# Patient Record
Sex: Male | Born: 1955 | Race: White | Hispanic: No | Marital: Married | Smoking: Current every day smoker
Health system: Southern US, Community
[De-identification: ages and names within clinical notes are randomized; demographics above are authoritative.]

## PROBLEM LIST (undated history)

## (undated) DIAGNOSIS — Z8719 Personal history of other diseases of the digestive system: Secondary | ICD-10-CM

## (undated) DIAGNOSIS — Z87442 Personal history of urinary calculi: Secondary | ICD-10-CM

## (undated) DIAGNOSIS — I10 Essential (primary) hypertension: Secondary | ICD-10-CM

## (undated) DIAGNOSIS — C801 Malignant (primary) neoplasm, unspecified: Secondary | ICD-10-CM

## (undated) DIAGNOSIS — J189 Pneumonia, unspecified organism: Secondary | ICD-10-CM

## (undated) DIAGNOSIS — N289 Disorder of kidney and ureter, unspecified: Secondary | ICD-10-CM

## (undated) DIAGNOSIS — I639 Cerebral infarction, unspecified: Secondary | ICD-10-CM

## (undated) HISTORY — PX: APPENDECTOMY: SHX54

## (undated) HISTORY — PX: LITHOTRIPSY: SUR834

## (undated) HISTORY — PX: HERNIA REPAIR: SHX51

## (undated) HISTORY — PX: SHOULDER SURGERY: SHX246

## (undated) HISTORY — PX: EYE SURGERY: SHX253

## (undated) HISTORY — PX: CHOLECYSTECTOMY: SHX55

---

## 2009-03-16 ENCOUNTER — Emergency Department: Payer: Self-pay | Admitting: Emergency Medicine

## 2009-09-06 ENCOUNTER — Ambulatory Visit: Payer: Self-pay | Admitting: Emergency Medicine

## 2009-09-06 DIAGNOSIS — I10 Essential (primary) hypertension: Secondary | ICD-10-CM | POA: Insufficient documentation

## 2009-09-06 DIAGNOSIS — N200 Calculus of kidney: Secondary | ICD-10-CM

## 2009-09-06 LAB — CONVERTED CEMR LAB
Nitrite: NEGATIVE
Specific Gravity, Urine: 1.02
pH: 6

## 2010-03-02 NOTE — Assessment & Plan Note (Signed)
Summary: Frequent, painful urination x 2 dys rm 2   Vital Signs:  Patient Profile:   55 Years Old Male CC:      Painful, frequent urination x 2 dys Height:     66 inches Weight:      168 pounds O2 Sat:      98 % O2 treatment:    Room Air Temp:     99.3 degrees F oral Pulse rate:   101 / minute Pulse rhythm:   regular Resp:     15 per minute BP sitting:   113 / 76  (right arm) Cuff size:   regular  Vitals Entered By: Areta Haber CMA (September 06, 2009 1:57 PM)                  Current Allergies (reviewed today): ! CODEINE     History of Present Illness Chief Complaint: Painful, frequent urination x 2 dys History of Present Illness: Dysuria, frequency for 2 days.  +hematuria, abdominal pain.  +h/o kidney stones and has a urologist.  No F/C/N/V.  Tolerating P.O.  No back or flank pain, no testicular pain, no hernias. No other medical issues. Also c/o R ear pain which is getting better.  Current Problems: NEPHROLITHIASIS (ICD-592.0) HYPERTENSION (ICD-401.9)   Current Meds LISINOPRIL 40 MG TABS (LISINOPRIL) 1 tab by mouth once daily VICODIN 5-500 MG TABS (HYDROCODONE-ACETAMINOPHEN) 1 tab by mouth Q6 hours as needed severe pain  REVIEW OF SYSTEMS Constitutional Symptoms      Denies fever, chills, night sweats, weight loss, weight gain, and fatigue.  Eyes       Denies change in vision, eye pain, eye discharge, glasses, contact lenses, and eye surgery. Ear/Nose/Throat/Mouth       Denies hearing loss/aids, change in hearing, ear pain, ear discharge, dizziness, frequent runny nose, frequent nose bleeds, sinus problems, sore throat, hoarseness, and tooth pain or bleeding.  Respiratory       Denies dry cough, productive cough, wheezing, shortness of breath, asthma, bronchitis, and emphysema/COPD.  Cardiovascular       Denies murmurs, chest pain, and tires easily with exhertion.    Gastrointestinal       Denies stomach pain, nausea/vomiting, diarrhea, constipation,  blood in bowel movements, and indigestion. Genitourniary       Complains of painful urination and kidney stones.      Denies loss of urinary control.      Comments: Frequent x 2 dys Neurological       Denies paralysis, seizures, and fainting/blackouts. Musculoskeletal       Denies muscle pain, joint pain, joint stiffness, decreased range of motion, redness, swelling, muscle weakness, and gout.  Skin       Denies bruising, unusual mles/lumps or sores, and hair/skin or nail changes.  Psych       Denies mood changes, temper/anger issues, anxiety/stress, speech problems, depression, and sleep problems.  Past History:  Past Medical History: Hypertension  Past Surgical History: Hernia Melonoma  Family History: Family History Hypertension Family History of Prostate CA 1st degree relative <50  Social History: Married Current Smoker - 1-1 1/2 packs daily Alcohol use-no Drug use-no Regular exercise-no Smoking Status:  current Drug Use:  no Does Patient Exercise:  no Physical Exam General appearance: well developed, well nourished, mild distress Head: normocephalic, atraumatic Ears: normal, no lesions or deformities Nasal: mucosa pink, nonedematous, no septal deviation, turbinates normal Oral/Pharynx: tongue normal, posterior pharynx without erythema or exudate Abdomen: + Suprapubic tenderness.  Murphy's and  Mcburney's neg.  ND.  +BS4Q.  R renal bruit heard. Soft. GU: normal Back: no CVAT MSE: oriented to time, place, and person Assessment New Problems: NEPHROLITHIASIS (ICD-592.0) HYPERTENSION (ICD-401.9)  LIkely kidney stone from UA results and physical exam.  However, I also discussed DDx with pt of appendicitis, AAA  Patient Education: Patient and/or caregiver instructed in the following: fluids.  Plan New Medications/Changes: VICODIN 5-500 MG TABS (HYDROCODONE-ACETAMINOPHEN) 1 tab by mouth Q6 hours as needed severe pain  #20 x 0, 09/06/2009, Hoyt Koch  MD  New Orders: New Patient Level III [phone] UA Dipstick w/o Micro (automated)  [81003] Planning Comments:   If pain gets any worse, moves to RLQ, N/V, Po intolerance, CP or any new symptoms, go to the ER He chooses not to have any additional imaging today b/c of no insurance (CT or KUB) and will wait to see if it gets worse. I suggest he call his urologist in the morning to make an appt Strain urine with strainer I gave to him  Follow Up: With urology this week  The patient and/or caregiver has been counseled thoroughly with regard to medications prescribed including dosage, schedule, interactions, rationale for use, and possible side effects and they verbalize understanding.  Diagnoses and expected course of recovery discussed and will return if not improved as expected or if the condition worsens. Patient and/or caregiver verbalized understanding.  Prescriptions: VICODIN 5-500 MG TABS (HYDROCODONE-ACETAMINOPHEN) 1 tab by mouth Q6 hours as needed severe pain  #20 x 0   Entered and Authorized by:   Hoyt Koch MD   Signed by:   Hoyt Koch MD on 09/06/2009   Method used:   Print then Give to Patient   RxID:   [id number]   Orders Added: 1)  New Patient Level III [38756] 2)  UA Dipstick w/o Micro (automated)  [81003]   Laboratory Results   Urine Tests  Date/Time Received: September 06, 2009 2:19 PM  Date/Time Reported: September 06, 2009 2:19 PM   Routine Urinalysis   Color: orange Appearance: Clear Glucose: negative   (Normal Range: Negative) Bilirubin: small   (Normal Range: Negative) Ketone: trace (5)   (Normal Range: Negative) Spec. Gravity: 1.020   (Normal Range: 1.003-1.035) Blood: moderate   (Normal Range: Negative) pH: 6.0   (Normal Range: 5.0-8.0) Protein: >=300   (Normal Range: Negative) Urobilinogen: 1.0   (Normal Range: 0-1) Nitrite: negative   (Normal Range: Negative) Leukocyte Esterace: negative   (Normal Range: Negative)

## 2013-01-31 DIAGNOSIS — I639 Cerebral infarction, unspecified: Secondary | ICD-10-CM

## 2013-01-31 HISTORY — DX: Cerebral infarction, unspecified: I63.9

## 2013-08-08 ENCOUNTER — Inpatient Hospital Stay (HOSPITAL_COMMUNITY)
Admission: EM | Admit: 2013-08-08 | Discharge: 2013-08-11 | DRG: 684 | Disposition: A | Discharge status: Home / Self Care | Payer: BC Managed Care – PPO | Attending: Internal Medicine | Admitting: Internal Medicine

## 2013-08-08 ENCOUNTER — Emergency Department (HOSPITAL_COMMUNITY): Payer: BC Managed Care – PPO

## 2013-08-08 ENCOUNTER — Encounter (HOSPITAL_COMMUNITY): Payer: Self-pay | Admitting: Emergency Medicine

## 2013-08-08 DIAGNOSIS — F17200 Nicotine dependence, unspecified, uncomplicated: Secondary | ICD-10-CM | POA: Diagnosis present

## 2013-08-08 DIAGNOSIS — Z7982 Long term (current) use of aspirin: Secondary | ICD-10-CM

## 2013-08-08 DIAGNOSIS — N179 Acute kidney failure, unspecified: Principal | ICD-10-CM | POA: Diagnosis present

## 2013-08-08 DIAGNOSIS — Z833 Family history of diabetes mellitus: Secondary | ICD-10-CM

## 2013-08-08 DIAGNOSIS — Z8249 Family history of ischemic heart disease and other diseases of the circulatory system: Secondary | ICD-10-CM

## 2013-08-08 DIAGNOSIS — Z85820 Personal history of malignant melanoma of skin: Secondary | ICD-10-CM

## 2013-08-08 DIAGNOSIS — Z8051 Family history of malignant neoplasm of kidney: Secondary | ICD-10-CM

## 2013-08-08 DIAGNOSIS — Q613 Polycystic kidney, unspecified: Secondary | ICD-10-CM

## 2013-08-08 DIAGNOSIS — N200 Calculus of kidney: Secondary | ICD-10-CM

## 2013-08-08 DIAGNOSIS — Z885 Allergy status to narcotic agent status: Secondary | ICD-10-CM

## 2013-08-08 DIAGNOSIS — D638 Anemia in other chronic diseases classified elsewhere: Secondary | ICD-10-CM | POA: Diagnosis present

## 2013-08-08 DIAGNOSIS — C4490 Unspecified malignant neoplasm of skin, unspecified: Secondary | ICD-10-CM | POA: Diagnosis present

## 2013-08-08 DIAGNOSIS — Z820 Family history of epilepsy and other diseases of the nervous system: Secondary | ICD-10-CM

## 2013-08-08 DIAGNOSIS — I10 Essential (primary) hypertension: Secondary | ICD-10-CM | POA: Diagnosis present

## 2013-08-08 HISTORY — DX: Essential (primary) hypertension: I10

## 2013-08-08 LAB — CBC WITH DIFFERENTIAL/PLATELET
BASOS ABS: 0 10*3/uL (ref 0.0–0.1)
Basophils Relative: 0 % (ref 0–1)
Eosinophils Absolute: 0.3 10*3/uL (ref 0.0–0.7)
Eosinophils Relative: 3 % (ref 0–5)
HCT: 37.5 % — ABNORMAL LOW (ref 39.0–52.0)
Hemoglobin: 12.9 g/dL — ABNORMAL LOW (ref 13.0–17.0)
LYMPHS PCT: 24 % (ref 12–46)
Lymphs Abs: 2.5 10*3/uL (ref 0.7–4.0)
MCH: 34.9 pg — ABNORMAL HIGH (ref 26.0–34.0)
MCHC: 34.4 g/dL (ref 30.0–36.0)
MCV: 101.4 fL — ABNORMAL HIGH (ref 78.0–100.0)
Monocytes Absolute: 0.9 10*3/uL (ref 0.1–1.0)
Monocytes Relative: 8 % (ref 3–12)
NEUTROS ABS: 6.7 10*3/uL (ref 1.7–7.7)
NEUTROS PCT: 65 % (ref 43–77)
PLATELETS: 293 10*3/uL (ref 150–400)
RBC: 3.7 MIL/uL — ABNORMAL LOW (ref 4.22–5.81)
RDW: 13.5 % (ref 11.5–15.5)
WBC: 10.5 10*3/uL (ref 4.0–10.5)

## 2013-08-08 LAB — COMPREHENSIVE METABOLIC PANEL
ALT: 14 U/L (ref 0–53)
AST: 14 U/L (ref 0–37)
Albumin: 3.9 g/dL (ref 3.5–5.2)
Alkaline Phosphatase: 88 U/L (ref 39–117)
Anion gap: 19 — ABNORMAL HIGH (ref 5–15)
BUN: 50 mg/dL — ABNORMAL HIGH (ref 6–23)
CALCIUM: 8.4 mg/dL (ref 8.4–10.5)
CHLORIDE: 105 meq/L (ref 96–112)
CO2: 17 meq/L — AB (ref 19–32)
Creatinine, Ser: 5.62 mg/dL — ABNORMAL HIGH (ref 0.50–1.35)
GFR calc Af Amer: 12 mL/min — ABNORMAL LOW (ref >90)
GFR, EST NON AFRICAN AMERICAN: 10 mL/min — AB (ref >90)
Glucose, Bld: 100 mg/dL — ABNORMAL HIGH (ref 70–99)
POTASSIUM: 4.8 meq/L (ref 3.7–5.3)
SODIUM: 141 meq/L (ref 137–147)
Total Protein: 7.1 g/dL (ref 6.0–8.3)

## 2013-08-08 LAB — RENAL FUNCTION PANEL
ANION GAP: 15 (ref 5–15)
Albumin: 3.2 g/dL — ABNORMAL LOW (ref 3.5–5.2)
BUN: 48 mg/dL — ABNORMAL HIGH (ref 6–23)
CALCIUM: 7.8 mg/dL — AB (ref 8.4–10.5)
CO2: 21 mEq/L (ref 19–32)
Chloride: 104 mEq/L (ref 96–112)
Creatinine, Ser: 4.97 mg/dL — ABNORMAL HIGH (ref 0.50–1.35)
GFR, EST AFRICAN AMERICAN: 14 mL/min — AB (ref >90)
GFR, EST NON AFRICAN AMERICAN: 12 mL/min — AB (ref >90)
GLUCOSE: 93 mg/dL (ref 70–99)
POTASSIUM: 4.2 meq/L (ref 3.7–5.3)
Phosphorus: 4.8 mg/dL — ABNORMAL HIGH (ref 2.3–4.6)
SODIUM: 140 meq/L (ref 137–147)

## 2013-08-08 LAB — CREATININE, URINE, RANDOM: Creatinine, Urine: 177.63 mg/dL

## 2013-08-08 LAB — URINALYSIS, ROUTINE W REFLEX MICROSCOPIC
Bilirubin Urine: NEGATIVE
GLUCOSE, UA: NEGATIVE mg/dL
HGB URINE DIPSTICK: NEGATIVE
Ketones, ur: NEGATIVE mg/dL
Leukocytes, UA: NEGATIVE
Nitrite: NEGATIVE
PROTEIN: 100 mg/dL — AB
Specific Gravity, Urine: 1.02 (ref 1.005–1.030)
Urobilinogen, UA: 0.2 mg/dL (ref 0.0–1.0)
pH: 5 (ref 5.0–8.0)

## 2013-08-08 LAB — PROTEIN / CREATININE RATIO, URINE
CREATININE, URINE: 180.14 mg/dL
Protein Creatinine Ratio: 0.28 — ABNORMAL HIGH (ref 0.00–0.15)
Total Protein, Urine: 51 mg/dL

## 2013-08-08 LAB — PROTEIN, URINE, RANDOM: Total Protein, Urine: 51 mg/dL

## 2013-08-08 LAB — URINE MICROSCOPIC-ADD ON

## 2013-08-08 MED ORDER — STERILE WATER FOR INJECTION IV SOLN
INTRAVENOUS | Status: DC
  Administered 2013-08-09 – 2013-08-10 (×4): via INTRAVENOUS
  Filled 2013-08-08 (×12): qty 850

## 2013-08-08 MED ORDER — HEPARIN SODIUM (PORCINE) 5000 UNIT/ML IJ SOLN
5000.0000 [IU] | Freq: Three times a day (TID) | INTRAMUSCULAR | Status: DC
  Administered 2013-08-08 – 2013-08-11 (×8): 5000 [IU] via SUBCUTANEOUS
  Filled 2013-08-08 (×11): qty 1

## 2013-08-08 MED ORDER — ACETAMINOPHEN 325 MG PO TABS: 650.0000 mg | ORAL_TABLET | Freq: Four times a day (QID) | ORAL | Status: DC | PRN

## 2013-08-08 MED ORDER — ASPIRIN EC 81 MG PO TBEC
81.0000 mg | DELAYED_RELEASE_TABLET | Freq: Every day | ORAL | Status: DC
  Administered 2013-08-09 – 2013-08-11 (×3): 81 mg via ORAL
  Filled 2013-08-08 (×3): qty 1

## 2013-08-08 MED ORDER — SODIUM CHLORIDE 0.9 % IV BOLUS (SEPSIS)
250.0000 mL | Freq: Once | INTRAVENOUS | Status: AC
  Administered 2013-08-08: 250 mL via INTRAVENOUS

## 2013-08-08 MED ORDER — NICOTINE 21 MG/24HR TD PT24
21.0000 mg | MEDICATED_PATCH | Freq: Every day | TRANSDERMAL | Status: DC
  Administered 2013-08-09 – 2013-08-11 (×4): 21 mg via TRANSDERMAL
  Filled 2013-08-08 (×4): qty 1

## 2013-08-08 MED ORDER — ACETAMINOPHEN 650 MG RE SUPP: 650.0000 mg | Freq: Four times a day (QID) | RECTAL | Status: DC | PRN

## 2013-08-08 MED ORDER — SODIUM CHLORIDE 0.9 % IV BOLUS (SEPSIS)
1000.0000 mL | Freq: Once | INTRAVENOUS | Status: AC
  Administered 2013-08-08: 1000 mL via INTRAVENOUS

## 2013-08-08 NOTE — Progress Notes (Signed)
Unit CM UR Completed by [facility] ED CM  W. Felipa Laroche RN  

## 2013-08-08 NOTE — H&P (Signed)
[facility] History and Physical  Martin Schneider KXF:[id number] DOB: Jan 04, 1956 DOA: 08/08/2013  Referring physician: EDP PCP: Dwan Bolt, MD   Chief Complaint: abnormal labs  HPI: Martin Schneider is a 58 y.o. male  With h/o HTN sent to ED with abnormal creatinine.  Patient feels well and has no complaints.  In ED, creatinine is 5.62. BUN 50, bicarbonate 17. Normal potassium.  Recently started on Azor. Previously on lisinopril.  Per EDP who called PCP, last creatinine was. 2.9. Renal US shows bilateral renal cysts without obstruction.  Patient denies change in amount or color of urine, denies edema. Denies heavy NSAID use.  Nephrology has already seen the patient, ordered bicarbonate drip and renal artery dopplers.   Review of Systems:  Systems reviewed. As above, otherwise negative.  Past Medical History  Diagnosis Date  . Hypertension   melanoma   Past Surgical History  Procedure Laterality Date  . Cholecystectomy    . Hernia repair    . Appendectomy    melanoma resection right arm  Social History:  reports that he has been smoking Cigarettes.  He has been smoking about 1.00 pack per day. He does not have any smokeless tobacco history on file. He reports that he does not drink alcohol. His drug history is not on file.  Allergies  Allergen Reactions  . Codeine     REACTION: HA    Family History  Problem Relation Age of Onset  . Renal cancer Father   . Hypertension Father   . Alzheimer's disease Father   . Kidney failure Mother   . Diabetes Mother      Prior to Admission medications   Medication Sig Start Date End Date Taking? Authorizing Provider  amLODipine-olmesartan (AZOR) 5-40 MG per tablet Take 1 tablet by mouth daily.   Yes Historical Provider, MD  aspirin EC 81 MG tablet Take 81 mg by mouth daily.   Yes Historical Provider, MD  MAGNESIUM PO Take 1 tablet by mouth daily.   Yes Historical Provider, MD  Multiple Vitamins-Minerals (MULTIVITAL PO) Take 1  tablet by mouth daily.   Yes Historical Provider, MD   Physical Exam: Filed Vitals:   08/08/13 1708  BP: 124/76  Pulse:   Temp: 97.9 F (36.6 C)  Resp: 22    BP 124/76  Pulse 62  Temp(Src) 97.9 F (36.6 C) (Oral)  Resp 22  Wt 78.019 kg (172 lb)  SpO2 100%  BP 104/64  Pulse 66  Temp(Src) 98.9 F (37.2 C) (Oral)  Resp 18  Wt 77.973 kg (171 lb 14.4 oz)  SpO2 97%  General Appearance:    Alert, cooperative, no distress, appears stated age  Head:    Normocephalic, without obvious abnormality, atraumatic  Eyes:    PERRL, conjunctiva/corneas clear, EOM's intact, fundi    benign, both eyes          Nose:   Nares normal, septum midline, mucosa normal, no drainage   or sinus tenderness  Throat:   Lips, mucosa, and tongue normal; teeth and gums normal  Neck:   Supple, symmetrical, trachea midline, no adenopathy;       thyroid:  No enlargement/tenderness/nodules; no carotid   bruit or JVD  Back:     Symmetric, no curvature, ROM normal, no CVA tenderness  Lungs:     Clear to auscultation bilaterally, respirations unlabored  Chest wall:    No tenderness or deformity  Heart:    Regular rate and rhythm, S1 and S2 normal,  no murmur, rub   or gallop  Abdomen:     Soft, non-tender, bowel sounds active all four quadrants,    no masses, no organomegaly  Genitalia:    deferred  Rectal:    deferred  Extremities:   Extremities normal, atraumatic, no cyanosis or edema  Pulses:   2+ and symmetric all extremities  Skin:   Ulceration bridge of nose, nodular areas behind right ear and right cheek  Lymph nodes:   Cervical, supraclavicular, and axillary nodes normal  Neurologic:   CNII-XII intact. Normal strength, sensation and reflexes      throughout            Labs on Admission:  Basic Metabolic Panel:  Recent Labs Lab 08/08/13 1222  NA 141  K 4.8  CL 105  CO2 17*  GLUCOSE 100*  BUN 50*  CREATININE 5.62*  CALCIUM 8.4   Liver Function Tests:  Recent Labs Lab  08/08/13 1222  AST 14  ALT 14  ALKPHOS 88  BILITOT <0.2*  PROT 7.1  ALBUMIN 3.9   No results found for this basename: LIPASE, AMYLASE,  in the last 168 hours No results found for this basename: AMMONIA,  in the last 168 hours CBC:  Recent Labs Lab 08/08/13 1222  WBC 10.5  NEUTROABS 6.7  HGB 12.9*  HCT 37.5*  MCV 101.4*  PLT 293   Cardiac Enzymes: No results found for this basename: CKTOTAL, CKMB, CKMBINDEX, TROPONINI,  in the last 168 hours  BNP (last 3 results) No results found for this basename: PROBNP,  in the last 8760 hours CBG: No results found for this basename: GLUCAP,  in the last 168 hours  Radiological Exams on Admission: Ct Head Wo Contrast  08/08/2013   CLINICAL DATA:  Blurred vision  EXAM: CT HEAD WITHOUT CONTRAST  TECHNIQUE: Contiguous axial images were obtained from the base of the skull through the vertex without intravenous contrast.  COMPARISON:  None.  FINDINGS: Mild atrophy. Chronic microvascular ischemic change in the white matter. Chronic ischemia head of caudate and left basal ganglia.  Negative for acute infarct. Negative for hemorrhage or mass. Calvarium intact.  Mild mucosal edema in the paranasal sinuses.  IMPRESSION: Chronic ischemic change.  No acute abnormality.   Electronically Signed   By: Franchot Gallo M.D.   On: 08/08/2013 17:08   US Renal  08/08/2013   CLINICAL DATA:  Left-sided flank pain with elevated renal functions  EXAM: RENAL/URINARY TRACT ULTRASOUND COMPLETE  COMPARISON:  None.  FINDINGS: Right Kidney:  Length: 9.2 cm. Multiple cystic lesions are identified throughout the right kidney. The largest of these lies in the upper pole measuring 2.5 cm.  Left Kidney:  Length: 8.9 cm. Multiple cysts are noted throughout the left kidney. The largest of these measures 4.8 cm within the lower pole of the left kidney. The bladder is partially distended.  Bladder:  Appears normal for degree of bladder distention.  IMPRESSION: Bilateral renal cystic  change.  No obstructive changes are noted.   Electronically Signed   By: Inez Catalina M.D.   On: 08/08/2013 16:17    Assessment/Plan   Acute renal failure: stop azor. Bicarb gtt. Other workup per nephrology.   Active Problems:   HYPERTENSION: hold meds for now.   Skin cancer: h/o melanoma resection. 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 Missed appointment with dermatologist yesterday. Reschedule and encouraged sunscreen use. Tobacco abuse: patch, counseled against   Code Status: full Family  Communication: wife Disposition Plan: home  Time spent: 60 min  [facility] Pager [phone]

## 2013-08-08 NOTE — ED Notes (Signed)
Per pt sts he went to the doctor for routine check up and was told to come here for renal failure due to abnormal blood work. Denies any pain besides his left leg.

## 2013-08-08 NOTE — ED Notes (Signed)
Nephrologist at bedside

## 2013-08-08 NOTE — H&P (Signed)
Patient ID: Martin Schneider male   DOB: 01/11/1956 58 y.o.   MRN: [id number]  Reason for Consult:  AKI Referring Physician:  ED  HPI: Martin Schneider is a 58 y.o. male with a PMH significant for HTN and nephrolithiasis (2011).  He presents to the ED upon advise of his PCP, Dr. Wilson Singer, that he come to the ED due to renal failure (Cr 5.62, BUN 50).  He reports previously being told 1 month ago that his kidneys were not as good as they could be.  Used to be on lisinopril but was put on azor 1 month ago.  Denies any CP, SOB, V/D/C, abdominal pain, hematochezia, melena, dysuria, hematuria, difficulty with urination.  Endorses mild nausea several days ago but no vomiting.  No rash.  No recent NSAID use, no procedures, or exposure to IV contrast.  Had a recent exercise stress test 2 weeks ago.  Smokes 1.5-2 ppd since the age of 50. Denies current alcohol (previously drank moonshine) or recreational drug use.  No FH of dialysis but father had a "growth" on his kidney and had a subsequent nephrectomy in his 68s.     PMH:   Past Medical History  Diagnosis Date  . Hypertension     PSH:   Past Surgical History  Procedure Laterality Date  . Cholecystectomy    . Hernia repair    . Appendectomy      Allergies:  Allergies  Allergen Reactions  . Codeine     REACTION: HA    Medications:   Prior to Admission medications   Medication Sig Start Date End Date Taking? Authorizing Provider  amLODipine-olmesartan (AZOR) 5-40 MG per tablet Take 1 tablet by mouth daily.   Yes Historical Provider, MD  aspirin EC 81 MG tablet Take 81 mg by mouth daily.   Yes Historical Provider, MD  MAGNESIUM PO Take 1 tablet by mouth daily.   Yes Historical Provider, MD  Multiple Vitamins-Minerals (MULTIVITAL PO) Take 1 tablet by mouth daily.   Yes Historical Provider, MD    Discontinued Meds:  There are no discontinued medications.  Social History:  reports that he has been smoking.  He does not have any smokeless tobacco  history on file. He reports that he does not drink alcohol. His drug history is not on file.  Family History:  History reviewed. No pertinent family history.  Review of Systems: Noted in HPI.   Creatinine, Ser  Date/Time Value Ref Range Status  08/08/2013 12:22 PM 5.62* 0.50 - 1.35 mg/dL Final    Recent Labs Lab 08/08/13 1222  NA 141  K 4.8  CL 105  CO2 17*  GLUCOSE 100*  BUN 50*  CREATININE 5.62*  CALCIUM 8.4    Recent Labs Lab 08/08/13 1222  WBC 10.5  NEUTROABS 6.7  HGB 12.9*  HCT 37.5*  MCV 101.4*  PLT 293   Liver Function Tests:  Recent Labs Lab 08/08/13 1222  AST 14  ALT 14  ALKPHOS 88  BILITOT <0.2*  PROT 7.1  ALBUMIN 3.9   No results found for this basename: LIPASE, AMYLASE,  in the last 168 hours No results found for this basename: AMMONIA,  in the last 168 hours Cardiac Enzymes: No results found for this basename: CKTOTAL, CKMB, CKMBINDEX, TROPONINI,  in the last 168 hours Iron Studies: No results found for this basename: IRON, TIBC, TRANSFERRIN, FERRITIN,  in the last 72 hours  Results for orders placed during the hospital encounter of 08/08/13 (from the  past 48 hour(s))  URINALYSIS, ROUTINE W REFLEX MICROSCOPIC     Status: Abnormal   Collection Time    08/08/13 12:10 PM      Result Value Ref Range   Color, Urine YELLOW  YELLOW   APPearance CLOUDY (*) CLEAR   Specific Gravity, Urine 1.020  1.005 - 1.030   pH 5.0  5.0 - 8.0   Glucose, UA NEGATIVE  NEGATIVE mg/dL   Hgb urine dipstick NEGATIVE  NEGATIVE   Bilirubin Urine NEGATIVE  NEGATIVE   Ketones, ur NEGATIVE  NEGATIVE mg/dL   Protein, ur 100 (*) NEGATIVE mg/dL   Urobilinogen, UA 0.2  0.0 - 1.0 mg/dL   Nitrite NEGATIVE  NEGATIVE   Leukocytes, UA NEGATIVE  NEGATIVE  URINE MICROSCOPIC-ADD ON     Status: Abnormal   Collection Time    08/08/13 12:10 PM      Result Value Ref Range   Squamous Epithelial / LPF FEW (*) RARE   WBC, UA 3-6  <3 WBC/hpf   Bacteria, UA RARE  RARE  CBC WITH  DIFFERENTIAL     Status: Abnormal   Collection Time    08/08/13 12:22 PM      Result Value Ref Range   WBC 10.5  4.0 - 10.5 K/uL   RBC 3.70 (*) 4.22 - 5.81 MIL/uL   Hemoglobin 12.9 (*) 13.0 - 17.0 g/dL   HCT 37.5 (*) 39.0 - 52.0 %   MCV 101.4 (*) 78.0 - 100.0 fL   MCH 34.9 (*) 26.0 - 34.0 pg   MCHC 34.4  30.0 - 36.0 g/dL   RDW 13.5  11.5 - 15.5 %   Platelets 293  150 - 400 K/uL   Neutrophils Relative % 65  43 - 77 %   Neutro Abs 6.7  1.7 - 7.7 K/uL   Lymphocytes Relative 24  12 - 46 %   Lymphs Abs 2.5  0.7 - 4.0 K/uL   Monocytes Relative 8  3 - 12 %   Monocytes Absolute 0.9  0.1 - 1.0 K/uL   Eosinophils Relative 3  0 - 5 %   Eosinophils Absolute 0.3  0.0 - 0.7 K/uL   Basophils Relative 0  0 - 1 %   Basophils Absolute 0.0  0.0 - 0.1 K/uL  COMPREHENSIVE METABOLIC PANEL     Status: Abnormal   Collection Time    08/08/13 12:22 PM      Result Value Ref Range   Sodium 141  137 - 147 mEq/L   Potassium 4.8  3.7 - 5.3 mEq/L   Chloride 105  96 - 112 mEq/L   CO2 17 (*) 19 - 32 mEq/L   Glucose, Bld 100 (*) 70 - 99 mg/dL   BUN 50 (*) 6 - 23 mg/dL   Creatinine, Ser 5.62 (*) 0.50 - 1.35 mg/dL   Calcium 8.4  8.4 - 10.5 mg/dL   Total Protein 7.1  6.0 - 8.3 g/dL   Albumin 3.9  3.5 - 5.2 g/dL   AST 14  0 - 37 U/L   ALT 14  0 - 53 U/L   Alkaline Phosphatase 88  39 - 117 U/L   Total Bilirubin <0.2 (*) 0.3 - 1.2 mg/dL   GFR calc non Af Amer 10 (*) >90 mL/min   GFR calc Af Amer 12 (*) >90 mL/min   Comment: (NOTE)     The eGFR has been calculated using the CKD EPI equation.     This calculation has not been  validated in all clinical situations.     eGFR's persistently <90 mL/min signify possible Chronic Kidney     Disease.   Anion gap 19 (*) 5 - 15    Ct Head Wo Contrast  08/08/2013   CLINICAL DATA:  Blurred vision  EXAM: CT HEAD WITHOUT CONTRAST  TECHNIQUE: Contiguous axial images were obtained from the base of the skull through the vertex without intravenous contrast.  COMPARISON:  None.   FINDINGS: Mild atrophy. Chronic microvascular ischemic change in the white matter. Chronic ischemia head of caudate and left basal ganglia.  Negative for acute infarct. Negative for hemorrhage or mass. Calvarium intact.  Mild mucosal edema in the paranasal sinuses.  IMPRESSION: Chronic ischemic change.  No acute abnormality.   Electronically Signed   By: Franchot Gallo M.D.   On: 08/08/2013 17:08   US Renal  08/08/2013   CLINICAL DATA:  Left-sided flank pain with elevated renal functions  EXAM: RENAL/URINARY TRACT ULTRASOUND COMPLETE  COMPARISON:  None.  FINDINGS: Right Kidney:  Length: 9.2 cm. Multiple cystic lesions are identified throughout the right kidney. The largest of these lies in the upper pole measuring 2.5 cm.  Left Kidney:  Length: 8.9 cm. Multiple cysts are noted throughout the left kidney. The largest of these measures 4.8 cm within the lower pole of the left kidney. The bladder is partially distended.  Bladder:  Appears normal for degree of bladder distention.  IMPRESSION: Bilateral renal cystic change.  No obstructive changes are noted.   Electronically Signed   By: Inez Catalina M.D.   On: 08/08/2013 16:17    Physical Exam: Blood pressure 124/76, pulse 66, temperature 97.9 F (36.6 C), temperature source Oral, resp. rate 22, weight 172 lb (78.019 kg), SpO2 100.00%. Constitutional: Vital signs reviewed.  Patient is well-developed and well-nourished in NAD.  HEENT: [place]/AT, PERRL, EOMI, conjunctivae normal, no scleral icterus, moist mucous membranes Neck: Supple, trachea midline normal ROM, no JVD Cardiovascular: RRR, no MRG Pulmonary/Chest: normal effort, CTAB, no wheezes, rales, or rhonchi Abdominal: Soft. Non-tender, non-distended, bowel sounds are normal, large midline scar.  Extremities: no C/C/E Neurological: A&O x3, cranial nerve II-XII are grossly intact, moving all extremities, no asterixis  Skin: Warm, dry and intact. No rash.    Assessment/Plan:  AKI vs. CKD-- Baseline  creatinine unknown.  Pt is followed by Dr. Wilson Singer of [facility].  Unclear etiology, pt recently started azor 1 month ago.  BUN/Cr ratio<20, urinary sediment unrevealing. Likely multifactorial given relative hypotension in the setting of recently starting azor.  However, also has significant scarring and multiple bilateral cysts on renal US.  Also with significant bladder distention, perhaps postrenal also contributing.  Basically, unclear if this is AKI or a progression of CKD.   -insert foley catheter -renal artery duplex US -d/c azor -strict I/Os, daily weights -renal function panel to monitor electolytes -iPTH, spot urine Cr/alb, 24 hr urine protein, Cr  -obtain records from Dr. Wilson Singer   Metabolic abnormalities-  Bicarb 17. -start sodium bicarb IV 122m/h   HTN-  Stable. -would monitor for now -d/c home meds   Anemia- Stable, hgb 12.9, denies melena, hematochezia, no active bleeding.  -monitor   JJones Bales MD Internal Medicine Teaching Service, PGY-2 08/08/2013, 5:25 PM I have seen and examined this patient and agree with the plan of care seen, examined, eval, discussed, family counseled.        Etiology not clear. Had exposure to ARB but not clear cut risk factors for toxicity.  Small, echodense kidneys, worrisome. R/O RAS, ? CIN .  Need old records.   .  Jonasia Coiner L 08/08/2013, 6:08 PM

## 2013-08-08 NOTE — ED Notes (Signed)
Attempted report x1. 

## 2013-08-08 NOTE — ED Provider Notes (Signed)
CSN: [id number]     Arrival date & time 08/08/13  1151 History   First MD Initiated Contact with Patient 08/08/13 1344     Chief Complaint  Patient presents with  . kidney issues      (Consider location/radiation/quality/duration/timing/severity/associated sxs/prior Treatment) The history is provided by the patient. No language interpreter was used.  Martin Schneider is a 58 y/o M with PMHx of HTN presenting to the ED with lab results that have resulted from this morning in his 26 office reporting that he has acute renal failure. Patient reported that he was notified by the office of his physician's and recommended to come into the ED to be assessed. Patient reported that he has noticed mildly more blurred vision than normal. Reported that on Tuesday he felts nauseous and had one episode of emesis. Reported that these symptoms have now resolved. Patient reported that he recently started Azor approximately 4 weeks ago for blood pressure control - patient curious that this could be the issue. Denied headache, dizziness, hematuria, abdominal pain, nausea, vomiting, chest pain, shortness of breath, difficulty breathing, fever, chills, changes to medication, changes to eating habits, sudden loss of vision, weakness, numbness, tingling. PCP Dr. Wilson Singer  Past Medical History  Diagnosis Date  . Hypertension    Past Surgical History  Procedure Laterality Date  . Cholecystectomy    . Hernia repair    . Appendectomy     History reviewed. No pertinent family history. History  Substance Use Topics  . Smoking status: Current Every Day Smoker  . Smokeless tobacco: Not on file  . Alcohol Use: No    Review of Systems  Constitutional: Negative for fever and chills.  Eyes: Positive for visual disturbance.  Respiratory: Negative for chest tightness and shortness of breath.   Cardiovascular: Negative for chest pain.  Gastrointestinal: Negative for nausea, vomiting, abdominal pain, diarrhea,  constipation, blood in stool and anal bleeding.  Genitourinary: Positive for flank pain (left sided flank pain ). Negative for hematuria and decreased urine volume.  Musculoskeletal: Negative for back pain and neck pain.  Neurological: Negative for dizziness, weakness and headaches.      Allergies  Codeine  Home Medications   Prior to Admission medications   Medication Sig Start Date End Date Taking? Authorizing Provider  amLODipine-olmesartan (AZOR) 5-40 MG per tablet Take 1 tablet by mouth daily.   Yes Historical Provider, MD  aspirin EC 81 MG tablet Take 81 mg by mouth daily.   Yes Historical Provider, MD  MAGNESIUM PO Take 1 tablet by mouth daily.   Yes Historical Provider, MD  Multiple Vitamins-Minerals (MULTIVITAL PO) Take 1 tablet by mouth daily.   Yes Historical Provider, MD   BP 124/76  Pulse 62  Temp(Src) 97.9 F (36.6 C) (Oral)  Resp 22  Wt 172 lb (78.019 kg)  SpO2 100% Physical Exam  Nursing note and vitals reviewed. Constitutional: He is oriented to person, place, and time. He appears well-developed and well-nourished.  HENT:  Head: Normocephalic and atraumatic.  Mouth/Throat: Oropharynx is clear and moist. No oropharyngeal exudate.  Eyes: Conjunctivae and EOM are normal. Pupils are equal, round, and reactive to light. Right eye exhibits no discharge. Left eye exhibits no discharge.  Negative nystagmus Visual fields grossly intact   Neck: Normal range of motion. Neck supple. No tracheal deviation present.  Cardiovascular: Normal rate, regular rhythm and normal heart sounds.  Exam reveals no friction rub.   No murmur heard. Pulses:      Radial  pulses are 2+ on the right side, and 2+ on the left side.       Dorsalis pedis pulses are 2+ on the right side, and 2+ on the left side.  Cap refill less than 3 seconds Negative swelling or pitting edema identified to lower extremities bilaterally  Pulmonary/Chest: Effort normal and breath sounds normal. No respiratory  distress. He has no wheezes. He has no rales.  Abdominal: Soft. Bowel sounds are normal. He exhibits no distension. There is no tenderness. There is no rebound and no guarding.  Negative abdominal distension  BS normoactive in all 4 quadrants Abdomen soft upon palpation  Negative peritoneal signs Negative rigidity and guarding   Mild left-sided CVA tenderness  Musculoskeletal: Normal range of motion. He exhibits no edema and no tenderness.  Full ROM to upper and lower extremities without difficulty noted, negative ataxia noted.  Lymphadenopathy:    He has no cervical adenopathy.  Neurological: He is alert and oriented to person, place, and time. No cranial nerve deficit. He exhibits normal muscle tone. Coordination normal.  Cranial nerves III-XII grossly intact Strength 5+/5+ to upper and lower extremities bilaterally with resistance applied, equal distribution noted Equal grip strength bilaterally Negative facial droop Negative slurred speech Negative aphasia GCS 15 Patient able to follow commands well   Skin: Skin is warm and dry. No rash noted. No erythema.  Psychiatric: He has a normal mood and affect. His behavior is normal. Thought content normal.    ED Course  Procedures (including critical care time)  This provider spoke with Nurse at patient's PCP's office - Hassan Rowan, as per Hassan Rowan reported that the most recent Creatinine was 2.9 in June 2015.  3:39 PM This provider spoke with Dr. Jimmy Footman, Nephrology - discussed case, history, labs, imaging in great detail. Nephrology to consult.   5:24 PM This provider spoke with Dr. Remus Blake - [facility] - discussed case, labs, imaging, and vitals in great detail. Patient to be admitted to the hospital regarding acute renal failure.   Results for orders placed during the hospital encounter of 08/08/13  CBC WITH DIFFERENTIAL      Result Value Ref Range   WBC 10.5  4.0 - 10.5 K/uL   RBC 3.70 (*) 4.22 - 5.81 MIL/uL    Hemoglobin 12.9 (*) 13.0 - 17.0 g/dL   HCT 37.5 (*) 39.0 - 52.0 %   MCV 101.4 (*) 78.0 - 100.0 fL   MCH 34.9 (*) 26.0 - 34.0 pg   MCHC 34.4  30.0 - 36.0 g/dL   RDW 13.5  11.5 - 15.5 %   Platelets 293  150 - 400 K/uL   Neutrophils Relative % 65  43 - 77 %   Neutro Abs 6.7  1.7 - 7.7 K/uL   Lymphocytes Relative 24  12 - 46 %   Lymphs Abs 2.5  0.7 - 4.0 K/uL   Monocytes Relative 8  3 - 12 %   Monocytes Absolute 0.9  0.1 - 1.0 K/uL   Eosinophils Relative 3  0 - 5 %   Eosinophils Absolute 0.3  0.0 - 0.7 K/uL   Basophils Relative 0  0 - 1 %   Basophils Absolute 0.0  0.0 - 0.1 K/uL  COMPREHENSIVE METABOLIC PANEL      Result Value Ref Range   Sodium 141  137 - 147 mEq/L   Potassium 4.8  3.7 - 5.3 mEq/L   Chloride 105  96 - 112 mEq/L   CO2 17 (*) 19 -  32 mEq/L   Glucose, Bld 100 (*) 70 - 99 mg/dL   BUN 50 (*) 6 - 23 mg/dL   Creatinine, Ser 5.62 (*) 0.50 - 1.35 mg/dL   Calcium 8.4  8.4 - 10.5 mg/dL   Total Protein 7.1  6.0 - 8.3 g/dL   Albumin 3.9  3.5 - 5.2 g/dL   AST 14  0 - 37 U/L   ALT 14  0 - 53 U/L   Alkaline Phosphatase 88  39 - 117 U/L   Total Bilirubin <0.2 (*) 0.3 - 1.2 mg/dL   GFR calc non Af Amer 10 (*) >90 mL/min   GFR calc Af Amer 12 (*) >90 mL/min   Anion gap 19 (*) 5 - 15  URINALYSIS, ROUTINE W REFLEX MICROSCOPIC      Result Value Ref Range   Color, Urine YELLOW  YELLOW   APPearance CLOUDY (*) CLEAR   Specific Gravity, Urine 1.020  1.005 - 1.030   pH 5.0  5.0 - 8.0   Glucose, UA NEGATIVE  NEGATIVE mg/dL   Hgb urine dipstick NEGATIVE  NEGATIVE   Bilirubin Urine NEGATIVE  NEGATIVE   Ketones, ur NEGATIVE  NEGATIVE mg/dL   Protein, ur 100 (*) NEGATIVE mg/dL   Urobilinogen, UA 0.2  0.0 - 1.0 mg/dL   Nitrite NEGATIVE  NEGATIVE   Leukocytes, UA NEGATIVE  NEGATIVE  URINE MICROSCOPIC-ADD ON      Result Value Ref Range   Squamous Epithelial / LPF FEW (*) RARE   WBC, UA 3-6  <3 WBC/hpf   Bacteria, UA RARE  RARE    Labs Review Labs Reviewed  CBC WITH DIFFERENTIAL  - Abnormal; Notable for the following:    RBC 3.70 (*)    Hemoglobin 12.9 (*)    HCT 37.5 (*)    MCV 101.4 (*)    MCH 34.9 (*)    All other components within normal limits  COMPREHENSIVE METABOLIC PANEL - Abnormal; Notable for the following:    CO2 17 (*)    Glucose, Bld 100 (*)    BUN 50 (*)    Creatinine, Ser 5.62 (*)    Total Bilirubin <0.2 (*)    GFR calc non Af Amer 10 (*)    GFR calc Af Amer 12 (*)    Anion gap 19 (*)    All other components within normal limits  URINALYSIS, ROUTINE W REFLEX MICROSCOPIC - Abnormal; Notable for the following:    APPearance CLOUDY (*)    Protein, ur 100 (*)    All other components within normal limits  URINE MICROSCOPIC-ADD ON - Abnormal; Notable for the following:    Squamous Epithelial / LPF FEW (*)    All other components within normal limits  PARATHYROID HORMONE, INTACT (NO CA)  PROTEIN / CREATININE RATIO, URINE  CREATININE, URINE, 24 HOUR  PROTEIN, URINE, 24 HOUR    Imaging Review Ct Head Wo Contrast  08/08/2013   CLINICAL DATA:  Blurred vision  EXAM: CT HEAD WITHOUT CONTRAST  TECHNIQUE: Contiguous axial images were obtained from the base of the skull through the vertex without intravenous contrast.  COMPARISON:  None.  FINDINGS: Mild atrophy. Chronic microvascular ischemic change in the white matter. Chronic ischemia head of caudate and left basal ganglia.  Negative for acute infarct. Negative for hemorrhage or mass. Calvarium intact.  Mild mucosal edema in the paranasal sinuses.  IMPRESSION: Chronic ischemic change.  No acute abnormality.   Electronically Signed   By: Franchot Gallo M.D.  On: 08/08/2013 17:08   US Renal  08/08/2013   CLINICAL DATA:  Left-sided flank pain with elevated renal functions  EXAM: RENAL/URINARY TRACT ULTRASOUND COMPLETE  COMPARISON:  None.  FINDINGS: Right Kidney:  Length: 9.2 cm. Multiple cystic lesions are identified throughout the right kidney. The largest of these lies in the upper pole measuring 2.5 cm.   Left Kidney:  Length: 8.9 cm. Multiple cysts are noted throughout the left kidney. The largest of these measures 4.8 cm within the lower pole of the left kidney. The bladder is partially distended.  Bladder:  Appears normal for degree of bladder distention.  IMPRESSION: Bilateral renal cystic change.  No obstructive changes are noted.   Electronically Signed   By: Inez Catalina M.D.   On: 08/08/2013 16:17     EKG Interpretation None      MDM   Final diagnoses:  Acute renal failure, unspecified acute renal failure type    Medications  sodium bicarbonate 150 mEq in sterile water 1,000 mL infusion (not administered)  sodium chloride 0.9 % bolus 250 mL (0 mLs Intravenous Stopped 08/08/13 1626)  sodium chloride 0.9 % bolus 1,000 mL (0 mLs Intravenous Stopped 08/08/13 1731)   Filed Vitals:   08/08/13 1445 08/08/13 1500 08/08/13 1530 08/08/13 1708  BP: 115/75 103/68 114/72 124/76  Pulse: 70 59 62   Temp:    97.9 F (36.6 C)  TempSrc:    Oral  Resp: 15 20 17 22   Weight:      SpO2: 100% 98% 100% 100%   CBC negative elevated white blood cell count-negative left shift or leukocytosis noted. Hemoglobin 12.9, hematocrit 0.5. CMP noted potassium, sodium, chloride within normal limits. BUN elevated at 50, creatinine elevated at 5.62. GFR 12. Urinalysis noted protein of 100-negative ketones, nitrites, leukocytes, hemoglobin noted. White blood cell count of 3-6. Renal ultrasound identified bilateral renal cystic change, no changes are identified. CT head without contrast chronic ischemic changes noted with negative acute abnormalities. Negative findings for intracranial hemorrhage or masses. Patient presenting to the ED with acute renal failure with a creatinine level of 5.62 and BUN of 50. Patient seen and assessed by nephrology. Patient started on IV fluids. Patient to be admitted to the hospital regarding acute renal failure. Discussed plan for admission the patient understood and agrees to plan of care.  Patient stable for transfer.  Jamse Mead, PA-C 08/08/13 1737

## 2013-08-08 NOTE — ED Notes (Signed)
CT called to follow up on pt transport to CT.

## 2013-08-09 DIAGNOSIS — N179 Acute kidney failure, unspecified: Secondary | ICD-10-CM

## 2013-08-09 DIAGNOSIS — I10 Essential (primary) hypertension: Secondary | ICD-10-CM

## 2013-08-09 LAB — RETICULOCYTES
RBC.: 3.24 MIL/uL — AB (ref 4.22–5.81)
RETIC CT PCT: 0.9 % (ref 0.4–3.1)
Retic Count, Absolute: 29.2 10*3/uL (ref 19.0–186.0)

## 2013-08-09 LAB — FOLATE: Folate: 9.7 ng/mL

## 2013-08-09 LAB — RENAL FUNCTION PANEL
ALBUMIN: 3.2 g/dL — AB (ref 3.5–5.2)
Anion gap: 13 (ref 5–15)
BUN: 44 mg/dL — AB (ref 6–23)
CHLORIDE: 101 meq/L (ref 96–112)
CO2: 26 mEq/L (ref 19–32)
Calcium: 8 mg/dL — ABNORMAL LOW (ref 8.4–10.5)
Creatinine, Ser: 4.19 mg/dL — ABNORMAL HIGH (ref 0.50–1.35)
GFR calc Af Amer: 17 mL/min — ABNORMAL LOW (ref >90)
GFR, EST NON AFRICAN AMERICAN: 14 mL/min — AB (ref >90)
Glucose, Bld: 105 mg/dL — ABNORMAL HIGH (ref 70–99)
PHOSPHORUS: 3.6 mg/dL (ref 2.3–4.6)
POTASSIUM: 4.4 meq/L (ref 3.7–5.3)
Sodium: 140 mEq/L (ref 137–147)

## 2013-08-09 LAB — FERRITIN: FERRITIN: 74 ng/mL (ref 22–322)

## 2013-08-09 LAB — IRON AND TIBC
Iron: 192 ug/dL — ABNORMAL HIGH (ref 42–135)
UIBC: <15 ug/dL — ABNORMAL LOW (ref 125–400)

## 2013-08-09 LAB — PARATHYROID HORMONE, INTACT (NO CA): PTH: 260.1 pg/mL — ABNORMAL HIGH (ref 14.0–72.0)

## 2013-08-09 LAB — VITAMIN B12: Vitamin B-12: >2000 pg/mL — ABNORMAL HIGH (ref 211–911)

## 2013-08-09 MED ORDER — FAMOTIDINE 20 MG PO TABS
20.0000 mg | ORAL_TABLET | Freq: Two times a day (BID) | ORAL | Status: DC | PRN
Start: 2013-08-09 — End: 2013-08-11
  Filled 2013-08-09: qty 1

## 2013-08-09 NOTE — Progress Notes (Signed)
Patient ID: EUGUNE SINE male   DOB: 1955/12/28 58 y.o.   MRN: [id number]  S:Pt is doing well this AM.  UOP: 988ml.  Denies any CP, SOB, N/V/D/C.  Labs not drawn this AM.   O:  Vital signs: BP 121/67  Pulse 65  Temp(Src) 98.4 F (36.9 C) (Oral)  Resp 21  Wt 171 lb 14.4 oz (77.973 kg)  SpO2 95%  Intake/Output:  Intake/Output Summary (Last 24 hours) at 08/09/13 1017 Last data filed at 08/09/13 0700  Gross per 24 hour  Intake   3050 ml  Output    900 ml  Net   2150 ml    Weight change: Filed Weights   08/08/13 1155 08/08/13 2012  Weight: 172 lb (78.019 kg) 171 lb 14.4 oz (77.973 kg)    Physical Exam: Gen:NAD, resting comfortably  CVS:RRR Resp:CTAB Abd:+BS, NT/ND Neuro: A&O x3, moving all extremties  Ext: No LE edema bilaterally, no UE edema   Recent Labs Lab 08/08/13 1222 08/08/13 2005  NA 141 140  K 4.8 4.2  CL 105 104  CO2 17* 21  GLUCOSE 100* 93  BUN 50* 48*  CREATININE 5.62* 4.97*  ALBUMIN 3.9 3.2*  CALCIUM 8.4 7.8*  PHOS  --  4.8*  AST 14  --   ALT 14  --     Liver Function Tests:  Recent Labs Lab 08/08/13 1222 08/08/13 2005  AST 14  --   ALT 14  --   ALKPHOS 88  --   BILITOT <0.2*  --   PROT 7.1  --   ALBUMIN 3.9 3.2*   No results found for this basename: LIPASE, AMYLASE,  in the last 168 hours No results found for this basename: AMMONIA,  in the last 168 hours  CBC:  Recent Labs Lab 08/08/13 1222  WBC 10.5  NEUTROABS 6.7  HGB 12.9*  HCT 37.5*  MCV 101.4*  PLT 293    Cardiac Enzymes: No results found for this basename: CKTOTAL, CKMB, CKMBINDEX, TROPONINI,  in the last 168 hours  CBG: No results found for this basename: GLUCAP,  in the last 168 hours  Iron Studies:  No results found for this basename: IRON, TIBC, TRANSFERRIN, FERRITIN,  in the last 72 hours  Studies/Results: Ct Head Wo Contrast  08/08/2013   CLINICAL DATA:  Blurred vision  EXAM: CT HEAD WITHOUT CONTRAST  TECHNIQUE: Contiguous axial images were obtained from  the base of the skull through the vertex without intravenous contrast.  COMPARISON:  None.  FINDINGS: Mild atrophy. Chronic microvascular ischemic change in the white matter. Chronic ischemia head of caudate and left basal ganglia.  Negative for acute infarct. Negative for hemorrhage or mass. Calvarium intact.  Mild mucosal edema in the paranasal sinuses.  IMPRESSION: Chronic ischemic change.  No acute abnormality.   Electronically Signed   By: Franchot Gallo M.D.   On: 08/08/2013 17:08   US Renal  08/08/2013   CLINICAL DATA:  Left-sided flank pain with elevated renal functions  EXAM: RENAL/URINARY TRACT ULTRASOUND COMPLETE  COMPARISON:  None.  FINDINGS: Right Kidney:  Length: 9.2 cm. Multiple cystic lesions are identified throughout the right kidney. The largest of these lies in the upper pole measuring 2.5 cm.  Left Kidney:  Length: 8.9 cm. Multiple cysts are noted throughout the left kidney. The largest of these measures 4.8 cm within the lower pole of the left kidney. The bladder is partially distended.  Bladder:  Appears normal for degree of bladder distention.  IMPRESSION: Bilateral renal cystic change.  No obstructive changes are noted.   Electronically Signed   By: Inez Catalina M.D.   On: 08/08/2013 16:17   . aspirin EC  81 mg Oral Daily  . heparin  5,000 Units Subcutaneous 3 times per day  . nicotine  21 mg Transdermal QHS    BMET:    Component Value Date/Time   NA 140 08/08/2013 2005   K 4.2 08/08/2013 2005   CL 104 08/08/2013 2005   CO2 21 08/08/2013 2005   GLUCOSE 93 08/08/2013 2005   BUN 48* 08/08/2013 2005   CREATININE 4.97* 08/08/2013 2005   CALCIUM 7.8* 08/08/2013 2005   GFRNONAA 12* 08/08/2013 2005   GFRAA 14* 08/08/2013 2005    CBC:    Component Value Date/Time   WBC 10.5 08/08/2013 1222   RBC 3.70* 08/08/2013 1222   HGB 12.9* 08/08/2013 1222   HCT 37.5* 08/08/2013 1222   PLT 293 08/08/2013 1222   MCV 101.4* 08/08/2013 1222   MCH 34.9* 08/08/2013 1222   MCHC 34.4 08/08/2013 1222   RDW 13.5 08/08/2013  1222   LYMPHSABS 2.5 08/08/2013 1222   MONOABS 0.9 08/08/2013 1222   EOSABS 0.3 08/08/2013 1222   BASOSABS 0.0 08/08/2013 1222     Assessment/Plan: AKI vs. CKD--  Baseline creatinine unknown, will obtain records from Dr. Wilson Singer.  Labs pending.  UOP 939ml yesterday, was supposed to have  foley catheter. Urine protein/Cr ratio 0.28.  Spot Cr 177, protein 51. -renal artery duplex US, pending -d/c azor  -strict I/Os, daily weights  -renal function panel to monitor electolytes  -iPTH pending -obtain records from Dr. Wilson Singer  -bladder scan   Metabolic abnormalities-  -continue sodium bicarb IV 13ml/h   HTN-  Stable.  -would monitor for now  -d/c home meds   Anemia-  Stable, hgb 12.9, denies melena, hematochezia, no active bleeding.  -check anemia panel   Jones Bales, MD Internal Medicine Teaching Service, PGY-2

## 2013-08-09 NOTE — Progress Notes (Signed)
I have personally seen and examined this patient and agree with the assessment/plan as outlined above by Gordy Levan MD (PGY2). Renal function improved overnight intravenous fluids. No evidence this morning of renal artery stenosis based on renal artery duplex. Awaiting records from Dr. Eugenio Hoes office to determine what baseline renal function was. Currently, no acute indications for dialysis. Urine P/C ratio within physiological limits. No other red flag findings in his urinalysis. Dafna Romo K.,MD 08/09/2013 11:22 AM

## 2013-08-09 NOTE — Progress Notes (Signed)
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 - Cr improving, probable home in am.  HYPERTENSION: - stable.    Code Status: full  Family Communication: wife  Disposition Plan: home   Consultants:  renal  Procedures: Renal US: Bilateral renal cystic change dupplex 7.10.2015: No evidence of significant renal artery stenosis (0-59%). Abnormal intrarenal resistive indices. Multiple cysts noted in the kidneys bilaterally. 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  Antibiotics:  None  HPI/Subjective: No complains  Objective: Filed Vitals:   08/08/13 2011 08/08/13 2012 08/09/13 0551 08/09/13 1002  BP: 104/64  113/66 121/67  Pulse: 66  68 65  Temp: 98.9 F (37.2 C)  98.2 F (36.8 C) 98.4 F (36.9 C)  TempSrc:    Oral  Resp: 18  18 21   Weight:  77.973 kg (171 lb 14.4 oz)    SpO2: 97%  97% 95%    Intake/Output Summary (Last 24 hours) at 08/09/13 1130 Last data filed at 08/09/13 1100  Gross per 24 hour  Intake   3410 ml  Output   1200 ml  Net   2210 ml   Filed Weights   08/08/13 1155 08/08/13 2012  Weight: 78.019 kg (172 lb) 77.973 kg (171 lb 14.4 oz)    Exam:  General: Alert, awake, oriented x3, in no acute distress.  HEENT: No bruits, no goiter.  Heart: Regular rate and rhythm, Lungs: Good air movement, clear Abdomen: Soft, nontender, nondistended, positive bowel sounds.  Neuro: Grossly intact, nonfocal.   Data Reviewed: Basic Metabolic Panel:  Recent Labs Lab 08/08/13 1222 08/08/13 2005  NA 141 140  K 4.8 4.2  CL 105 104  CO2 17* 21  GLUCOSE 100* 93  BUN 50* 48*  CREATININE 5.62* 4.97*  CALCIUM 8.4 7.8*  PHOS  --  4.8*   Liver Function Tests:  Recent Labs Lab 08/08/13 1222 08/08/13 2005  AST 14  --   ALT 14  --   ALKPHOS 88  --   BILITOT <0.2*  --   PROT 7.1  --   ALBUMIN 3.9 3.2*   No results found for this basename: LIPASE, AMYLASE,  in the last 168  hours No results found for this basename: AMMONIA,  in the last 168 hours CBC:  Recent Labs Lab 08/08/13 1222  WBC 10.5  NEUTROABS 6.7  HGB 12.9*  HCT 37.5*  MCV 101.4*  PLT 293   Cardiac Enzymes: No results found for this basename: CKTOTAL, CKMB, CKMBINDEX, TROPONINI,  in the last 168 hours BNP (last 3 results) No results found for this basename: PROBNP,  in the last 8760 hours CBG: No results found for this basename: GLUCAP,  in the last 168 hours  No results found for this or any previous visit (from the past 240 hour(s)).   Studies: Ct Head Wo Contrast  08/08/2013   CLINICAL DATA:  Blurred vision  EXAM: CT HEAD WITHOUT CONTRAST  TECHNIQUE: Contiguous axial images were obtained from the base of the skull through the vertex without intravenous contrast.  COMPARISON:  None.  FINDINGS: Mild atrophy. Chronic microvascular ischemic change in the white matter. Chronic ischemia head of caudate and left basal ganglia.  Negative for acute infarct. Negative for hemorrhage or mass. Calvarium intact.  Mild mucosal edema in the paranasal sinuses.  IMPRESSION: Chronic ischemic change.  No acute abnormality.   Electronically Signed   By: Franchot Gallo M.D.   On: 08/08/2013 17:08   US Renal  08/08/2013   CLINICAL DATA:  Left-sided flank pain with elevated renal functions  EXAM: RENAL/URINARY TRACT ULTRASOUND COMPLETE  COMPARISON:  None.  FINDINGS: Right Kidney:  Length: 9.2 cm. Multiple cystic lesions are identified throughout the right kidney. The largest of these lies in the upper pole measuring 2.5 cm.  Left Kidney:  Length: 8.9 cm. Multiple cysts are noted throughout the left kidney. The largest of these measures 4.8 cm within the lower pole of the left kidney. The bladder is partially distended.  Bladder:  Appears normal for degree of bladder distention.  IMPRESSION: Bilateral renal cystic change.  No obstructive changes are noted.   Electronically Signed   By: Inez Catalina M.D.   On: 08/08/2013  16:17    Scheduled Meds: . aspirin EC  81 mg Oral Daily  . heparin  5,000 Units Subcutaneous 3 times per day  . nicotine  21 mg Transdermal QHS   Continuous Infusions: .  sodium bicarbonate 150 mEq in sterile water 1000 mL infusion 150 mL/hr at 08/09/13 0206     Charlynne Cousins  [facility] Pager [phone]. If 8PM-8AM, please contact night-coverage at [email or web address], password [facility] 08/09/2013, 11:30 AM  LOS: 1 day      **Disclaimer: This note may have been dictated with voice recognition software. Similar sounding words can inadvertently be transcribed and this note may contain transcription errors which may not have been corrected upon publication of note.**

## 2013-08-09 NOTE — Progress Notes (Signed)
Bilateral renal artery duplex completed.  No evidence of significant renal artery stenosis (0-59%).  Abnormal intrarenal resistive indices.  Multiple cysts noted in the kidneys bilaterally.  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

## 2013-08-10 DIAGNOSIS — Q613 Polycystic kidney, unspecified: Secondary | ICD-10-CM

## 2013-08-10 LAB — BASIC METABOLIC PANEL
Anion gap: 9 (ref 5–15)
BUN: 43 mg/dL — AB (ref 6–23)
CALCIUM: 7.7 mg/dL — AB (ref 8.4–10.5)
CO2: 34 mEq/L — ABNORMAL HIGH (ref 19–32)
CREATININE: 3.45 mg/dL — AB (ref 0.50–1.35)
Chloride: 97 mEq/L (ref 96–112)
GFR calc Af Amer: 21 mL/min — ABNORMAL LOW (ref >90)
GFR, EST NON AFRICAN AMERICAN: 18 mL/min — AB (ref >90)
GLUCOSE: 131 mg/dL — AB (ref 70–99)
Potassium: 4.5 mEq/L (ref 3.7–5.3)
Sodium: 140 mEq/L (ref 137–147)

## 2013-08-10 NOTE — Progress Notes (Signed)
I have personally seen and examined this patient and agree with the assessment/plan as outlined above by Denton Brick MD (PGY2). Improving renal function with as yet and unclear cause of AKI- last creatinine in June at Dr.Kohut's office was 2.9 and I am not sure if that is his baseline. 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 No acute HD needs and no evidence that this is a GN. Will DC IVFs.   Allesandra Huebsch K.,MD 08/10/2013 2:54 PM

## 2013-08-10 NOTE — Progress Notes (Signed)
Patient ID: Martin Schneider male   DOB: 27-Apr-1955 58 y.o.   MRN: [id number]  S: Pt doing well this morning. No nausea, vomiting or SOB or leg sweling, no itching. Pt wanted to know if he could go home today.  O:  Vital signs: BP 118/75  Pulse 63  Temp(Src) 97.7 F (36.5 C) (Oral)  Resp 18  Ht 5\' 4"  (1.626 m)  Wt 171 lb 14.4 oz (77.973 kg)  BMI 29.49 kg/m2  SpO2 96%  Intake/Output:  Intake/Output Summary (Last 24 hours) at 08/10/13 1132 Last data filed at 08/10/13 0900  Gross per 24 hour  Intake   3000 ml  Output   1725 ml  Net   1275 ml    Weight change: Filed Weights   08/08/13 1155 08/08/13 2012 08/09/13 2059  Weight: 172 lb (78.019 kg) 171 lb 14.4 oz (77.973 kg) 171 lb 14.4 oz (77.973 kg)    Physical Exam: Gen:NAD, ambulating in room. CVS:RRR Resp: Normal work of breathing, no added sounds,  Abd: +BS, NT/ND Neuro: A&O x3, moving all extremties  Ext: No LE edema bilaterally, no UE edema   Recent Labs Lab 08/08/13 1222 08/08/13 2005 08/09/13 1012  NA 141 140 140  K 4.8 4.2 4.4  CL 105 104 101  CO2 17* 21 26  GLUCOSE 100* 93 105*  BUN 50* 48* 44*  CREATININE 5.62* 4.97* 4.19*  ALBUMIN 3.9 3.2* 3.2*  CALCIUM 8.4 7.8* 8.0*  PHOS  --  4.8* 3.6  AST 14  --   --   ALT 14  --   --     Liver Function Tests:  Recent Labs Lab 08/08/13 1222 08/08/13 2005 08/09/13 1012  AST 14  --   --   ALT 14  --   --   ALKPHOS 88  --   --   BILITOT <0.2*  --   --   PROT 7.1  --   --   ALBUMIN 3.9 3.2* 3.2*   CBC:  Recent Labs Lab 08/08/13 1222  WBC 10.5  NEUTROABS 6.7  HGB 12.9*  HCT 37.5*  MCV 101.4*  PLT 293  Iron Studies:   Recent Labs  08/09/13 1012  IRON 192*  TIBC NOT CALC  FERRITIN 74    Studies/Results: Ct Head Wo Contrast  08/08/2013   CLINICAL DATA:    IMPRESSION: Chronic ischemic change.  No acute abnormality.   Electronically Signed   By: Franchot Gallo M.D.   On: 08/08/2013 17:08   US Renal  08/08/2013   CLINICAL DATA:   IMPRESSION:  Bilateral renal cystic change.  No obstructive changes are noted.   Electronically Signed   By: Inez Catalina M.D.   On: 08/08/2013 16:17   . aspirin EC  81 mg Oral Daily  . heparin  5,000 Units Subcutaneous 3 times per day  . nicotine  21 mg Transdermal QHS    BMET:    Component Value Date/Time   NA 140 08/09/2013 1012   K 4.4 08/09/2013 1012   CL 101 08/09/2013 1012   CO2 26 08/09/2013 1012   GLUCOSE 105* 08/09/2013 1012   BUN 44* 08/09/2013 1012   CREATININE 4.19* 08/09/2013 1012   CALCIUM 8.0* 08/09/2013 1012   GFRNONAA 14* 08/09/2013 1012   GFRAA 17* 08/09/2013 1012    CBC:    Component Value Date/Time   WBC 10.5 08/08/2013 1222   RBC 3.24* 08/09/2013 1012   RBC 3.70* 08/08/2013 1222   HGB 12.9*  08/08/2013 1222   HCT 37.5* 08/08/2013 1222   PLT 293 08/08/2013 1222   MCV 101.4* 08/08/2013 1222   MCH 34.9* 08/08/2013 1222   MCHC 34.4 08/08/2013 1222   RDW 13.5 08/08/2013 1222   LYMPHSABS 2.5 08/08/2013 1222   MONOABS 0.9 08/08/2013 1222   EOSABS 0.3 08/08/2013 1222   BASOSABS 0.0 08/08/2013 1222     Assessment/Plan: AKI vs. CKD-- Cr today- 4.19, improved from 5.62 two days ago. Baseline cr- 2.9, 07/09/2013, from records obtained from Dr. Wilson Singer. Continuing to have good UOP 1725 ml yesterday. Renal Uss- Cysts present bilat kidneys, meets criteria for PKD. Consider d/c home today, and to hold Azor for now, can continue with amlodipine on discharge.  Metabolic abnormalities-  -continue sodium bicarb IV 13ml/h   HTN- Stable.   Anemia- Stable, hgb 12.9, denies melena, hematochezia, no active bleeding. Anemia panel of suggestive of iron, B12 or folate deficiency. B12- Increased, an acute phase reactant, to follow up with PCP.  Jenetta Downer, MD Internal Medicine Teaching Service, PGY-2

## 2013-08-10 NOTE — ED Provider Notes (Signed)
Medical screening examination/treatment/procedure(s) were performed by non-physician practitioner and as supervising physician I was immediately available for consultation/collaboration.   EKG Interpretation None        Carmin Muskrat, MD 08/10/13 2344

## 2013-08-10 NOTE — Progress Notes (Signed)
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 - B-met pending  HYPERTENSION: - stable.   Anemia of chronic disease: - per renal.  Code Status: full  Family Communication: wife  Disposition Plan: home   Consultants:  renal  Procedures: Renal US: Bilateral renal cystic change dupplex 7.10.2015: No evidence of significant renal artery stenosis (0-59%). Abnormal intrarenal resistive indices. Multiple cysts noted in the kidneys bilaterally. 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  Objective: Filed Vitals:   08/09/13 1751 08/09/13 2059 08/10/13 0449 08/10/13 0900  BP: 119/73 125/65 118/75 127/75  Pulse: 75 74 63 80  Temp: 98.6 F (37 C) 98.7 F (37.1 C) 97.7 F (36.5 C) 97.9 F (36.6 C)  TempSrc: Oral Oral Oral Oral  Resp: 21 20 18 18   Height:  5' 4"  (1.626 m)    Weight:  77.973 kg (171 lb 14.4 oz)    SpO2: 93% 95% 96% 94%    Intake/Output Summary (Last 24 hours) at 08/10/13 1211 Last data filed at 08/10/13 1100  Gross per 24 hour  Intake   3240 ml  Output   2125 ml  Net   1115 ml   Filed Weights   08/08/13 1155 08/08/13 2012 08/09/13 2059  Weight: 78.019 kg (172 lb) 77.973 kg (171 lb 14.4 oz) 77.973 kg (171 lb 14.4 oz)    Exam:  General: Alert, awake, oriented x3, in no acute distress.  HEENT: No bruits, no goiter.  Heart: Regular rate and rhythm, Lungs: Good air movement, clear Abdomen: Soft, nontender, nondistended, positive bowel sounds.  Neuro: Grossly intact, nonfocal.   Data Reviewed: Basic Metabolic Panel:  Recent Labs Lab 08/08/13 1222 08/08/13 2005 08/09/13 1012  NA 141 140 140  K 4.8 4.2 4.4  CL 105 104 101  CO2 17* 21 26  GLUCOSE 100* 93 105*  BUN 50* 48* 44*  CREATININE 5.62* 4.97* 4.19*  CALCIUM 8.4 7.8* 8.0*  PHOS  --  4.8* 3.6   Liver Function Tests:  Recent Labs Lab  08/08/13 1222 08/08/13 2005 08/09/13 1012  AST 14  --   --   ALT 14  --   --   ALKPHOS 88  --   --   BILITOT <0.2*  --   --   PROT 7.1  --   --   ALBUMIN 3.9 3.2* 3.2*   No results found for this basename: LIPASE, AMYLASE,  in the last 168 hours No results found for this basename: AMMONIA,  in the last 168 hours CBC:  Recent Labs Lab 08/08/13 1222  WBC 10.5  NEUTROABS 6.7  HGB 12.9*  HCT 37.5*  MCV 101.4*  PLT 293   Cardiac Enzymes: No results found for this basename: CKTOTAL, CKMB, CKMBINDEX, TROPONINI,  in the last 168 hours BNP (last 3 results) No results found for this basename: PROBNP,  in the last 8760 hours CBG: No results found for this basename: GLUCAP,  in the last 168 hours  No results found for this or any previous visit (from the past 240 hour(s)).   Studies: Ct Head Wo Contrast  08/08/2013   CLINICAL DATA:  Blurred vision  EXAM: CT HEAD WITHOUT CONTRAST  TECHNIQUE: Contiguous axial images were obtained from the base of the skull through the vertex without intravenous contrast.  COMPARISON:  None.  FINDINGS: Mild atrophy. Chronic microvascular ischemic change in the white matter. Chronic ischemia  head of caudate and left basal ganglia.  Negative for acute infarct. Negative for hemorrhage or mass. Calvarium intact.  Mild mucosal edema in the paranasal sinuses.  IMPRESSION: Chronic ischemic change.  No acute abnormality.   Electronically Signed   By: Franchot Gallo M.D.   On: 08/08/2013 17:08   US Renal  08/08/2013   CLINICAL DATA:  Left-sided flank pain with elevated renal functions  EXAM: RENAL/URINARY TRACT ULTRASOUND COMPLETE  COMPARISON:  None.  FINDINGS: Right Kidney:  Length: 9.2 cm. Multiple cystic lesions are identified throughout the right kidney. The largest of these lies in the upper pole measuring 2.5 cm.  Left Kidney:  Length: 8.9 cm. Multiple cysts are noted throughout the left kidney. The largest of these measures 4.8 cm within the lower pole of the  left kidney. The bladder is partially distended.  Bladder:  Appears normal for degree of bladder distention.  IMPRESSION: Bilateral renal cystic change.  No obstructive changes are noted.   Electronically Signed   By: Inez Catalina M.D.   On: 08/08/2013 16:17    Scheduled Meds: . aspirin EC  81 mg Oral Daily  . heparin  5,000 Units Subcutaneous 3 times per day  . nicotine  21 mg Transdermal QHS   Continuous Infusions: .  sodium bicarbonate 150 mEq in sterile water 1000 mL infusion 150 mL/hr at 08/10/13 [place]iloh, Ethel Veronica  [facility] Pager [phone]. If 8PM-8AM, please contact night-coverage at [email or web address], password [facility] 08/10/2013, 12:11 PM  LOS: 2 days      **Disclaimer: This note may have been dictated with voice recognition software. Similar sounding words can inadvertently be transcribed and this note may contain transcription errors which may not have been corrected upon publication of note.**

## 2013-08-11 LAB — HEPATITIS PANEL, ACUTE
HCV Ab: NEGATIVE
Hep A IgM: NONREACTIVE
Hep B C IgM: NONREACTIVE
Hepatitis B Surface Ag: NEGATIVE

## 2013-08-11 LAB — RENAL FUNCTION PANEL
ANION GAP: 13 (ref 5–15)
Albumin: 3 g/dL — ABNORMAL LOW (ref 3.5–5.2)
BUN: 46 mg/dL — ABNORMAL HIGH (ref 6–23)
CALCIUM: 8.1 mg/dL — AB (ref 8.4–10.5)
CO2: 29 mEq/L (ref 19–32)
Chloride: 97 mEq/L (ref 96–112)
Creatinine, Ser: 3.46 mg/dL — ABNORMAL HIGH (ref 0.50–1.35)
GFR calc Af Amer: 21 mL/min — ABNORMAL LOW (ref >90)
GFR calc non Af Amer: 18 mL/min — ABNORMAL LOW (ref >90)
GLUCOSE: 99 mg/dL (ref 70–99)
POTASSIUM: 4.7 meq/L (ref 3.7–5.3)
Phosphorus: 4.5 mg/dL (ref 2.3–4.6)
Sodium: 139 mEq/L (ref 137–147)

## 2013-08-11 MED ORDER — AMLODIPINE BESYLATE 10 MG PO TABS: 10.0000 mg | ORAL_TABLET | Freq: Every day | ORAL | Status: AC

## 2013-08-11 NOTE — Progress Notes (Signed)
Patient ID: WYN NETTLE male   DOB: Dec 19, 1955 58 y.o.   MRN: [id number]  S: Good UOP: 3L yesterday.  No complaints.   O:  Vital signs: BP 144/78  Pulse 58  Temp(Src) 97.8 F (36.6 C) (Oral)  Resp 18  Ht 5\' 4"  (1.626 m)  Wt 171 lb 14.4 oz (77.973 kg)  BMI 29.49 kg/m2  SpO2 96%  Intake/Output:  Intake/Output Summary (Last 24 hours) at 08/11/13 1204 Last data filed at 08/11/13 0900  Gross per 24 hour  Intake   1560 ml  Output   2900 ml  Net  -1340 ml    Weight change: Filed Weights   08/08/13 2012 08/09/13 2059 08/10/13 2036  Weight: 171 lb 14.4 oz (77.973 kg) 171 lb 14.4 oz (77.973 kg) 171 lb 14.4 oz (77.973 kg)    Physical Exam: Gen:NAD CVS:RRR Resp: CTAB Abd: +BS, NT/ND Neuro: A&O x3, moving all extremties  Ext: No LE edema bilaterally, no UE edema   Recent Labs Lab 08/08/13 1222 08/08/13 2005 08/09/13 1012 08/10/13 1240 08/11/13 0419  NA 141 140 140 140 139  K 4.8 4.2 4.4 4.5 4.7  CL 105 104 101 97 97  CO2 17* 21 26 34* 29  GLUCOSE 100* 93 105* 131* 99  BUN 50* 48* 44* 43* 46*  CREATININE 5.62* 4.97* 4.19* 3.45* 3.46*  ALBUMIN 3.9 3.2* 3.2*  --  3.0*  CALCIUM 8.4 7.8* 8.0* 7.7* 8.1*  PHOS  --  4.8* 3.6  --  4.5  AST 14  --   --   --   --   ALT 14  --   --   --   --     Liver Function Tests:  Recent Labs Lab 08/08/13 1222 08/08/13 2005 08/09/13 1012 08/11/13 0419  AST 14  --   --   --   ALT 14  --   --   --   ALKPHOS 88  --   --   --   BILITOT <0.2*  --   --   --   PROT 7.1  --   --   --   ALBUMIN 3.9 3.2* 3.2* 3.0*   CBC:  Recent Labs Lab 08/08/13 1222  WBC 10.5  NEUTROABS 6.7  HGB 12.9*  HCT 37.5*  MCV 101.4*  PLT 293  Iron Studies:   Recent Labs  08/09/13 1012  IRON 192*  TIBC NOT CALC  FERRITIN 74    Studies/Results: Ct Head Wo Contrast  08/08/2013   CLINICAL DATA:    IMPRESSION: Chronic ischemic change.  No acute abnormality.   Electronically Signed   By: Franchot Gallo M.D.   On: 08/08/2013 17:08   US  Renal  08/08/2013   CLINICAL DATA:   IMPRESSION: Bilateral renal cystic change.  No obstructive changes are noted.   Electronically Signed   By: Inez Catalina M.D.   On: 08/08/2013 16:17   . aspirin EC  81 mg Oral Daily  . heparin  5,000 Units Subcutaneous 3 times per day  . nicotine  21 mg Transdermal QHS    BMET:    Component Value Date/Time   NA 139 08/11/2013 0419   K 4.7 08/11/2013 0419   CL 97 08/11/2013 0419   CO2 29 08/11/2013 0419   GLUCOSE 99 08/11/2013 0419   BUN 46* 08/11/2013 0419   CREATININE 3.46* 08/11/2013 0419   CALCIUM 8.1* 08/11/2013 0419   GFRNONAA 18* 08/11/2013 0419   GFRAA 21* 08/11/2013 0419  CBC:    Component Value Date/Time   WBC 10.5 08/08/2013 1222   RBC 3.24* 08/09/2013 1012   RBC 3.70* 08/08/2013 1222   HGB 12.9* 08/08/2013 1222   HCT 37.5* 08/08/2013 1222   PLT 293 08/08/2013 1222   MCV 101.4* 08/08/2013 1222   MCH 34.9* 08/08/2013 1222   MCHC 34.4 08/08/2013 1222   RDW 13.5 08/08/2013 1222   LYMPHSABS 2.5 08/08/2013 1222   MONOABS 0.9 08/08/2013 1222   EOSABS 0.3 08/08/2013 1222   BASOSABS 0.0 08/08/2013 1222     Assessment/Plan: AKI vs. CKD-- Cr 3.46 today, on admission was 5.62 .  Cr- 2.9, 07/09/2013, from Dr. Wilson Singer, but unclear if this is his baseline.  However, Cr seems to have responded to IVF.  Good UOP.  Renal US -bilateral cysts, meets criteria for PKD.   -continue to hold azor for now, continue amlodipine on discharge -close follow-up with nephrology on d/c   HTN- Stable.   Anemia- Stable, hgb 12.9, denies melena, hematochezia, no active bleeding.  Iron 192, ferritin 74.    Jones Bales, MD Internal Medicine Teaching Service, PGY-2

## 2013-08-11 NOTE — Progress Notes (Signed)
Patient to be discharged to home with wife. Discharge instructions reviewed with patient. Smoking cessation packet given to patient. All belongings returned. PIV removed.  Joellen Jersey, RN.

## 2013-08-11 NOTE — Progress Notes (Signed)
I have personally seen and examined this patient and agree with the assessment/plan as outlined above by Gordy Levan MD (PGY2). Acute renal failure that is not well explained in on a background of chronic kidney disease possibly from hypertension/PKD. May possibly be able to discharge him home today but before he leaves, we'll send off serologies including antinuclear antibody, complement levels, plasma cell dyscrasia screening and acute hepatitis panel. No obvious evidence that this is an acute glomerulonephritis however, unexplained acute renal failure requires further inquiry especially if now that his creatinine appears to have plateaued at about 3.5. We'll arrange for renal followup and weekly labs until he seen in the office Chonda Baney K.,MD 08/11/2013 12:18 PM

## 2013-08-11 NOTE — Discharge Summary (Signed)
Physician Discharge Summary  Martin Schneider ZOX:[id number] DOB: 1955/12/10 DOA: 08/08/2013  PCP: Dwan Bolt, MD  Admit date: 08/08/2013 Discharge date: 08/11/2013  Time spent: 35 minutes  Recommendations for Outpatient Follow-up:  1. Follow up with renal in 2 weeks.   Discharge Diagnoses:  Active Problems:   HYPERTENSION   Acute renal failure   Skin cancer   Discharge Condition: stable  Diet recommendation: regular  Filed Weights   08/08/13 2012 08/09/13 2059 08/10/13 2036  Weight: 77.973 kg (171 lb 14.4 oz) 77.973 kg (171 lb 14.4 oz) 77.973 kg (171 lb 14.4 oz)    History of present illness:  58 y.o. male  With h/o HTN sent to ED with abnormal creatinine. Patient feels well and has no complaints. In ED, creatinine is 5.62. BUN 50, bicarbonate 17. Normal potassium. Recently started on Azor. Previously on lisinopril. Per EDP who called PCP, last creatinine was. 2.9. Renal US shows bilateral renal cysts without obstruction. Patient denies change in amount or color of urine, denies edema. Denies heavy NSAID use. Nephrology has already seen the patient, ordered bicarbonate drip and renal artery dopplers.      Hospital Course:  Acute renal failure  - [place] compatible show multiple cyst - Appreciate renal assistance. Unclear etiology. - Follow up on labs ANA, C3, C4, hepatitis, Kappa/lambda light chain, ANCA, SPEP  HYPERTENSION:  - stable.  - home on amlodipine.  Anemia of chronic disease:  - follow up with renal   Procedures:  Renal US  Consultations:  renal  Discharge Exam: Filed Vitals:   08/11/13 1000  BP: 144/78  Pulse: 58  Temp: 97.8 F (36.6 C)  Resp: 18    General: A&O x3 Cardiovascular: RRR Respiratory: good air movement CTA B/L  Discharge Instructions You were cared for by a hospitalist during your hospital stay. If you have any questions about your discharge medications or the care you received while you were in the hospital after you are  discharged, you can call the unit and asked to speak with the hospitalist on call if the hospitalist that took care of you is not available. Once you are discharged, your primary care physician will handle any further medical issues. Please note that NO REFILLS for any discharge medications will be authorized once you are discharged, as it is imperative that you return to your primary care physician (or establish a relationship with a primary care physician if you do not have one) for your aftercare needs so that they can reassess your need for medications and monitor your lab values.      Discharge Instructions   Diet - low sodium heart healthy    Complete by:  As directed      Increase activity slowly    Complete by:  As directed             Medication List    STOP taking these medications       AZOR 5-40 MG per tablet  Generic drug:  amLODipine-olmesartan     MAGNESIUM PO     MULTIVITAL PO      TAKE these medications       amLODipine 10 MG tablet  Commonly known as:  NORVASC  Take 1 tablet (10 mg total) by mouth daily.     aspirin EC 81 MG tablet  Take 81 mg by mouth daily.       Allergies  Allergen Reactions  . Codeine     REACTION: HA   Follow-up Information  Follow up with [facility] In 2 weeks. (hospital follow up)    Contact information:   [place] [id number] [phone]        The results of significant diagnostics from this hospitalization (including imaging, microbiology, ancillary and laboratory) are listed below for reference.    Significant Diagnostic Studies: Ct Head Wo Contrast  08/08/2013   CLINICAL DATA:  Blurred vision  EXAM: CT HEAD WITHOUT CONTRAST  TECHNIQUE: Contiguous axial images were obtained from the base of the skull through the vertex without intravenous contrast.  COMPARISON:  None.  FINDINGS: Mild atrophy. Chronic microvascular ischemic change in the white matter. Chronic ischemia head of caudate and left basal  ganglia.  Negative for acute infarct. Negative for hemorrhage or mass. Calvarium intact.  Mild mucosal edema in the paranasal sinuses.  IMPRESSION: Chronic ischemic change.  No acute abnormality.   Electronically Signed   By: Franchot Gallo M.D.   On: 08/08/2013 17:08   US Renal  08/08/2013   CLINICAL DATA:  Left-sided flank pain with elevated renal functions  EXAM: RENAL/URINARY TRACT ULTRASOUND COMPLETE  COMPARISON:  None.  FINDINGS: Right Kidney:  Length: 9.2 cm. Multiple cystic lesions are identified throughout the right kidney. The largest of these lies in the upper pole measuring 2.5 cm.  Left Kidney:  Length: 8.9 cm. Multiple cysts are noted throughout the left kidney. The largest of these measures 4.8 cm within the lower pole of the left kidney. The bladder is partially distended.  Bladder:  Appears normal for degree of bladder distention.  IMPRESSION: Bilateral renal cystic change.  No obstructive changes are noted.   Electronically Signed   By: Inez Catalina M.D.   On: 08/08/2013 16:17    Microbiology: No results found for this or any previous visit (from the past 240 hour(s)).   Labs: Basic Metabolic Panel:  Recent Labs Lab 08/08/13 1222 08/08/13 2005 08/09/13 1012 08/10/13 1240 08/11/13 0419  NA 141 140 140 140 139  K 4.8 4.2 4.4 4.5 4.7  CL 105 104 101 97 97  CO2 17* 21 26 34* 29  GLUCOSE 100* 93 105* 131* 99  BUN 50* 48* 44* 43* 46*  CREATININE 5.62* 4.97* 4.19* 3.45* 3.46*  CALCIUM 8.4 7.8* 8.0* 7.7* 8.1*  PHOS  --  4.8* 3.6  --  4.5   Liver Function Tests:  Recent Labs Lab 08/08/13 1222 08/08/13 2005 08/09/13 1012 08/11/13 0419  AST 14  --   --   --   ALT 14  --   --   --   ALKPHOS 88  --   --   --   BILITOT <0.2*  --   --   --   PROT 7.1  --   --   --   ALBUMIN 3.9 3.2* 3.2* 3.0*   No results found for this basename: LIPASE, AMYLASE,  in the last 168 hours No results found for this basename: AMMONIA,  in the last 168 hours CBC:  Recent Labs Lab  08/08/13 1222  WBC 10.5  NEUTROABS 6.7  HGB 12.9*  HCT 37.5*  MCV 101.4*  PLT 293   Cardiac Enzymes: No results found for this basename: CKTOTAL, CKMB, CKMBINDEX, TROPONINI,  in the last 168 hours BNP: BNP (last 3 results) No results found for this basename: PROBNP,  in the last 8760 hours CBG: No results found for this basename: GLUCAP,  in the last 168 hours     Signed:  Charlynne Cousins  [facility] 08/11/2013, 12:29 PM

## 2013-08-12 LAB — MPO/PR-3 (ANCA) ANTIBODIES: Serine Protease 3: <1

## 2013-08-12 LAB — KAPPA/LAMBDA LIGHT CHAINS
Kappa free light chain: 3.06 mg/dL — ABNORMAL HIGH (ref 0.33–1.94)
Kappa, lambda light chain ratio: 1.2 (ref 0.26–1.65)
Lambda free light chains: 2.54 mg/dL (ref 0.57–2.63)

## 2013-08-12 LAB — ANA: Anti Nuclear Antibody(ANA): NEGATIVE

## 2013-08-13 LAB — PROTEIN ELECTROPHORESIS, SERUM
ALBUMIN ELP: 58.5 % (ref 55.8–66.1)
Alpha-1-Globulin: 5.1 % — ABNORMAL HIGH (ref 2.9–4.9)
Alpha-2-Globulin: 11.6 % (ref 7.1–11.8)
BETA 2: 4.8 % (ref 3.2–6.5)
BETA GLOBULIN: 6.2 % (ref 4.7–7.2)
Gamma Globulin: 13.8 % (ref 11.1–18.8)
M-Spike, %: NOT DETECTED g/dL
Total Protein ELP: 5.7 g/dL — ABNORMAL LOW (ref 6.0–8.3)

## 2013-08-13 LAB — C4 COMPLEMENT: COMPLEMENT C4, BODY FLUID: 30 mg/dL (ref 10–40)

## 2013-08-13 LAB — C3 COMPLEMENT: C3 Complement: 132 mg/dL (ref 90–180)

## 2013-08-28 ENCOUNTER — Encounter (HOSPITAL_COMMUNITY): Payer: Self-pay | Admitting: Emergency Medicine

## 2013-08-28 ENCOUNTER — Observation Stay (HOSPITAL_COMMUNITY): Payer: BC Managed Care – PPO

## 2013-08-28 ENCOUNTER — Inpatient Hospital Stay (HOSPITAL_COMMUNITY)
Admission: EM | Admit: 2013-08-28 | Discharge: 2013-08-30 | DRG: 065 | Disposition: A | Discharge status: Home / Self Care | Payer: BC Managed Care – PPO | Attending: Internal Medicine | Admitting: Internal Medicine

## 2013-08-28 ENCOUNTER — Emergency Department (HOSPITAL_COMMUNITY): Payer: BC Managed Care – PPO

## 2013-08-28 DIAGNOSIS — R4701 Aphasia: Secondary | ICD-10-CM | POA: Diagnosis present

## 2013-08-28 DIAGNOSIS — N184 Chronic kidney disease, stage 4 (severe): Secondary | ICD-10-CM | POA: Diagnosis present

## 2013-08-28 DIAGNOSIS — I129 Hypertensive chronic kidney disease with stage 1 through stage 4 chronic kidney disease, or unspecified chronic kidney disease: Secondary | ICD-10-CM | POA: Diagnosis present

## 2013-08-28 DIAGNOSIS — F17200 Nicotine dependence, unspecified, uncomplicated: Secondary | ICD-10-CM | POA: Diagnosis present

## 2013-08-28 DIAGNOSIS — I639 Cerebral infarction, unspecified: Secondary | ICD-10-CM

## 2013-08-28 DIAGNOSIS — C4490 Unspecified malignant neoplasm of skin, unspecified: Secondary | ICD-10-CM

## 2013-08-28 DIAGNOSIS — I10 Essential (primary) hypertension: Secondary | ICD-10-CM

## 2013-08-28 DIAGNOSIS — I6509 Occlusion and stenosis of unspecified vertebral artery: Secondary | ICD-10-CM | POA: Diagnosis present

## 2013-08-28 DIAGNOSIS — Z9089 Acquired absence of other organs: Secondary | ICD-10-CM

## 2013-08-28 DIAGNOSIS — H5440 Blindness, one eye, unspecified eye: Secondary | ICD-10-CM | POA: Diagnosis present

## 2013-08-28 DIAGNOSIS — G458 Other transient cerebral ischemic attacks and related syndromes: Secondary | ICD-10-CM

## 2013-08-28 DIAGNOSIS — R29898 Other symptoms and signs involving the musculoskeletal system: Secondary | ICD-10-CM | POA: Diagnosis present

## 2013-08-28 DIAGNOSIS — C4491 Basal cell carcinoma of skin, unspecified: Secondary | ICD-10-CM | POA: Diagnosis present

## 2013-08-28 DIAGNOSIS — I6350 Cerebral infarction due to unspecified occlusion or stenosis of unspecified cerebral artery: Principal | ICD-10-CM | POA: Diagnosis present

## 2013-08-28 DIAGNOSIS — Z23 Encounter for immunization: Secondary | ICD-10-CM

## 2013-08-28 DIAGNOSIS — R531 Weakness: Secondary | ICD-10-CM

## 2013-08-28 DIAGNOSIS — G459 Transient cerebral ischemic attack, unspecified: Secondary | ICD-10-CM

## 2013-08-28 DIAGNOSIS — G451 Carotid artery syndrome (hemispheric): Secondary | ICD-10-CM

## 2013-08-28 DIAGNOSIS — N179 Acute kidney failure, unspecified: Secondary | ICD-10-CM | POA: Diagnosis present

## 2013-08-28 DIAGNOSIS — H269 Unspecified cataract: Secondary | ICD-10-CM | POA: Diagnosis present

## 2013-08-28 DIAGNOSIS — N200 Calculus of kidney: Secondary | ICD-10-CM

## 2013-08-28 DIAGNOSIS — M6281 Muscle weakness (generalized): Secondary | ICD-10-CM

## 2013-08-28 DIAGNOSIS — R29810 Facial weakness: Secondary | ICD-10-CM | POA: Diagnosis present

## 2013-08-28 DIAGNOSIS — C44319 Basal cell carcinoma of skin of other parts of face: Secondary | ICD-10-CM | POA: Diagnosis present

## 2013-08-28 DIAGNOSIS — R4789 Other speech disturbances: Secondary | ICD-10-CM | POA: Diagnosis present

## 2013-08-28 DIAGNOSIS — R209 Unspecified disturbances of skin sensation: Secondary | ICD-10-CM | POA: Diagnosis present

## 2013-08-28 DIAGNOSIS — Z7982 Long term (current) use of aspirin: Secondary | ICD-10-CM

## 2013-08-28 HISTORY — DX: Disorder of kidney and ureter, unspecified: N28.9

## 2013-08-28 LAB — I-STAT TROPONIN, ED: TROPONIN I, POC: 0 ng/mL (ref 0.00–0.08)

## 2013-08-28 LAB — CBC
HCT: 36.4 % — ABNORMAL LOW (ref 39.0–52.0)
Hemoglobin: 12.2 g/dL — ABNORMAL LOW (ref 13.0–17.0)
MCH: 34.6 pg — ABNORMAL HIGH (ref 26.0–34.0)
MCHC: 33.5 g/dL (ref 30.0–36.0)
MCV: 103.1 fL — ABNORMAL HIGH (ref 78.0–100.0)
Platelets: 268 10*3/uL (ref 150–400)
RBC: 3.53 MIL/uL — ABNORMAL LOW (ref 4.22–5.81)
RDW: 13.7 % (ref 11.5–15.5)
WBC: 10.4 10*3/uL (ref 4.0–10.5)

## 2013-08-28 LAB — COMPREHENSIVE METABOLIC PANEL
ALBUMIN: 3.9 g/dL (ref 3.5–5.2)
ALT: 9 U/L (ref 0–53)
AST: 11 U/L (ref 0–37)
Alkaline Phosphatase: 78 U/L (ref 39–117)
Anion gap: 15 (ref 5–15)
BUN: 28 mg/dL — ABNORMAL HIGH (ref 6–23)
CALCIUM: 8.9 mg/dL (ref 8.4–10.5)
CO2: 20 mEq/L (ref 19–32)
CREATININE: 2.64 mg/dL — AB (ref 0.50–1.35)
Chloride: 106 mEq/L (ref 96–112)
GFR calc Af Amer: 29 mL/min — ABNORMAL LOW (ref >90)
GFR, EST NON AFRICAN AMERICAN: 25 mL/min — AB (ref >90)
Glucose, Bld: 84 mg/dL (ref 70–99)
Potassium: 4.9 mEq/L (ref 3.7–5.3)
Sodium: 141 mEq/L (ref 137–147)
Total Bilirubin: 0.3 mg/dL (ref 0.3–1.2)
Total Protein: 7 g/dL (ref 6.0–8.3)

## 2013-08-28 LAB — DIFFERENTIAL
Basophils Absolute: 0.1 10*3/uL (ref 0.0–0.1)
Basophils Relative: 1 % (ref 0–1)
EOS PCT: 4 % (ref 0–5)
Eosinophils Absolute: 0.4 10*3/uL (ref 0.0–0.7)
Lymphocytes Relative: 21 % (ref 12–46)
Lymphs Abs: 2.1 10*3/uL (ref 0.7–4.0)
MONO ABS: 0.6 10*3/uL (ref 0.1–1.0)
Monocytes Relative: 5 % (ref 3–12)
Neutro Abs: 7.3 10*3/uL (ref 1.7–7.7)
Neutrophils Relative %: 69 % (ref 43–77)

## 2013-08-28 LAB — PROTIME-INR
INR: 0.98 (ref 0.00–1.49)
Prothrombin Time: 13 seconds (ref 11.6–15.2)

## 2013-08-28 LAB — CBG MONITORING, ED: GLUCOSE-CAPILLARY: 89 mg/dL (ref 70–99)

## 2013-08-28 LAB — APTT: aPTT: 27 seconds (ref 24–37)

## 2013-08-28 MED ORDER — SENNOSIDES-DOCUSATE SODIUM 8.6-50 MG PO TABS: 1.0000 | ORAL_TABLET | Freq: Every evening | ORAL | Status: DC | PRN

## 2013-08-28 MED ORDER — ASPIRIN EC 325 MG PO TBEC
325.0000 mg | DELAYED_RELEASE_TABLET | Freq: Every day | ORAL | Status: DC
  Administered 2013-08-28 – 2013-08-30 (×3): 325 mg via ORAL
  Filled 2013-08-28 (×3): qty 1

## 2013-08-28 MED ORDER — STROKE: EARLY STAGES OF RECOVERY BOOK
Freq: Once | Status: AC
Start: 2013-08-28 — End: 2013-08-28
  Administered 2013-08-28: 22:00:00
  Filled 2013-08-28: qty 1

## 2013-08-28 MED ORDER — HEPARIN SODIUM (PORCINE) 5000 UNIT/ML IJ SOLN
5000.0000 [IU] | Freq: Three times a day (TID) | INTRAMUSCULAR | Status: DC
  Administered 2013-08-28 – 2013-08-30 (×4): 5000 [IU] via SUBCUTANEOUS
  Filled 2013-08-28 (×4): qty 1

## 2013-08-28 MED ORDER — NICOTINE 21 MG/24HR TD PT24
21.0000 mg | MEDICATED_PATCH | Freq: Every day | TRANSDERMAL | Status: DC
  Administered 2013-08-28 – 2013-08-29 (×2): 21 mg via TRANSDERMAL
  Filled 2013-08-28 (×2): qty 1

## 2013-08-28 MED ORDER — SODIUM CHLORIDE 0.9 % IV SOLN
INTRAVENOUS | Status: AC
  Administered 2013-08-28: 22:00:00 via INTRAVENOUS

## 2013-08-28 MED ORDER — PNEUMOCOCCAL VAC POLYVALENT 25 MCG/0.5ML IJ INJ
0.5000 mL | INJECTION | INTRAMUSCULAR | Status: AC
  Administered 2013-08-30: 0.5 mL via INTRAMUSCULAR
  Filled 2013-08-28: qty 0.5

## 2013-08-28 NOTE — ED Notes (Signed)
Dr. Delo at bedside. 

## 2013-08-28 NOTE — H&P (Signed)
[facility] History and Physical  ZAKHAI MEISINGER WUJ:[id number] DOB: 04-09-1955 DOA: 08/28/2013  Referring physician:  PCP: Dwan Bolt, MD  Specialists: Dr. Posey Pronto, nephrologist  Chief Complaint: Left-sided numbness and speech problems.  HPI: Martin Schneider is a 58 y.o. male  With a history of hypertension, recent admission for acute kidney injury, that presented to the emergency department with complaints of left arm and leg numbness and expressive aphasia. Patient stated the last couple of mornings he woke up with the symptoms however as the day progressed his symptoms began to resolve. Patient woke up this morning with similar complaints he came to emergency department. Patient does state he takes a daily aspirin. Patient denies any history of CVA. Patient does admit to being in the hospital approximately 2 weeks ago for kidney problems, and was supposed to see Dr. Posey Pronto, nephrology, today for followup appointment. Patient denies any recent illness, fevers, chills, sick contacts, or recent travel.  Review of Systems:  Constitutional: Denies fever, chills, diaphoresis, appetite change and fatigue.  HEENT: Denies photophobia, eye pain, redness, hearing loss, ear pain, congestion, sore throat, rhinorrhea, sneezing, mouth sores, trouble swallowing, neck pain, neck stiffness and tinnitus.   Respiratory: Denies SOB, DOE, cough, chest tightness,  and wheezing.   Cardiovascular: Denies chest pain, palpitations and leg swelling.  Gastrointestinal: Denies nausea, vomiting, abdominal pain, diarrhea, constipation, blood in stool and abdominal distention.  Genitourinary: Denies dysuria, urgency, frequency, hematuria, flank pain and difficulty urinating.  Musculoskeletal: Denies myalgias, back pain, joint swelling, arthralgias and gait problem.  Skin: Denies pallor, rash and wound.  Neurological: Left-sided weakness and numbness with speech problems. Hematological: Denies adenopathy. Easy  bruising, personal or family bleeding history  Psychiatric/Behavioral: Denies suicidal ideation, mood changes, confusion, nervousness, sleep disturbance and agitation  Past Medical History  Diagnosis Date  . Hypertension    Past Surgical History  Procedure Laterality Date  . Cholecystectomy    . Hernia repair    . Appendectomy     Social History:  reports that he has been smoking Cigarettes.  He has been smoking about 1.00 pack per day. He does not have any smokeless tobacco history on file. He reports that he does not drink alcohol. His drug history is not on file.  Allergies  Allergen Reactions  . Codeine     REACTION: HA    Family History  Problem Relation Age of Onset  . Renal cancer Father   . Hypertension Father   . Alzheimer's disease Father   . Kidney failure Mother   . Diabetes Mother     Prior to Admission medications   Medication Sig Start Date End Date Taking? Authorizing Provider  amLODipine (NORVASC) 10 MG tablet Take 1 tablet (10 mg total) by mouth daily. 08/11/13  Yes Charlynne Cousins, MD  aspirin EC 81 MG tablet Take 81 mg by mouth daily.   Yes Historical Provider, MD   Physical Exam: Filed Vitals:   08/28/13 1730  BP: 139/73  Pulse: 57  Temp:   Resp: 15     General: Well developed, well nourished, NAD, appears stated age  HEENT: NCAT, scab noted near right ear, nasal bridge, PERRLA, EOMI, Anicteic Sclera, mucous membranes moist.   Lesion noted on Right ear  Neck: Supple, no JVD, no masses  Cardiovascular: S1 S2 auscultated, no rubs, murmurs or gallops. Regular rate and rhythm.  Respiratory: Clear to auscultation bilaterally with equal chest rise  Abdomen: Soft, nontender, nondistended, + bowel sounds  Extremities: warm dry  without cyanosis clubbing or edema  Neuro: AAOx3, cranial nerves grossly intact. Strength 5/5 in patient's upper and lower extremities bilaterally  Skin: Without rashes exudates or nodules  Psych: Normal affect and  demeanor with intact judgement and insight  Labs on Admission:  Basic Metabolic Panel:  Recent Labs Lab 08/28/13 1400  NA 141  K 4.9  CL 106  CO2 20  GLUCOSE 84  BUN 28*  CREATININE 2.64*  CALCIUM 8.9   Liver Function Tests:  Recent Labs Lab 08/28/13 1400  AST 11  ALT 9  ALKPHOS 78  BILITOT 0.3  PROT 7.0  ALBUMIN 3.9   No results found for this basename: LIPASE, AMYLASE,  in the last 168 hours No results found for this basename: AMMONIA,  in the last 168 hours CBC:  Recent Labs Lab 08/28/13 1400  WBC 10.4  NEUTROABS 7.3  HGB 12.2*  HCT 36.4*  MCV 103.1*  PLT 268   Cardiac Enzymes: No results found for this basename: CKTOTAL, CKMB, CKMBINDEX, TROPONINI,  in the last 168 hours  BNP (last 3 results) No results found for this basename: PROBNP,  in the last 8760 hours CBG:  Recent Labs Lab 08/28/13 1721  GLUCAP 89    Radiological Exams on Admission: Ct Head (brain) Wo Contrast  08/28/2013   CLINICAL DATA:  Slurred speech.  Left arm and leg numbness.  EXAM: CT HEAD WITHOUT CONTRAST  TECHNIQUE: Contiguous axial images were obtained from the base of the skull through the vertex without intravenous contrast.  COMPARISON:  08/08/2013  FINDINGS: The brainstem and cerebellum appear normal. 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 No sign of acute infarction, mass lesion, hemorrhage, hydrocephalus or extra-axial collection. The calvarium is unremarkable. No significant sinus disease.  IMPRESSION: No change since the previous study. 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 No acute finding.   Electronically Signed   By: Nelson Chimes M.D.   On: 08/28/2013 14:25    EKG: Independently reviewed. Sinus rhythm, rate 71  Assessment/Plan   Left-sided weakness and expressive aphasia/TIA versus CVA -Patient be admitted for observation -CT of the head: No change from previous study, old infarction  affecting the left basal ganglia and external capsule. (Patient does not recall having a CVA) -Will place patient on telemetry to monitor for any tachycardia or bradycardia arrhythmias -Will conduct neuro checks, obtain MRI/MRA of the brain, carotid Doppler, echocardiogram, lipid panel, hemoglobin A1c -Will consult PT OT and speech therapy evaluation and treatment -Neurology contacted by the emergency department -Will place patient on full dose aspirin  Acute kidney injury -Patient recently admitted to ago for acute kidney injury, with a creatinine of 5 -Creatinine currently trending downward -Will place patient on IV fluids and continue to monitor creatinine -Will call nephrology as patient had an appointment today  Tobacco abuse -Will order nicotine patch -Patient counseled smoking cessation  Hypertension -Will allow permissive hypertension given his symptoms -Hold Amlodipine  Basal cell carcinoma -Patient sees a dermatologist as an outpatient  DVT prophylaxis: Heparin  Code Status: Full  Condition: Guarded  Family Communication: Wife at bedside. Admission, patients condition and plan of care including tests being ordered have been discussed with the patient and wife who indicate understanding and agree with the plan and Code Status.  Disposition Plan: Admitted for observation    Time spent: 60 minutes  Stan Cantave D.O. [facility] Pager [phone]  If 7PM-7AM, please contact night-coverage [email or web address] Password [email or web address]  08/28/2013, 5:56 PM

## 2013-08-28 NOTE — ED Notes (Signed)
Pt c/o slurred speech and left arm and leg numbness that yesterday. Went to pcp and sent here for further eval. Denies HA. Nad, skin warm and dry, resp e/u.

## 2013-08-28 NOTE — ED Notes (Signed)
EKG done at 1402 in triage

## 2013-08-28 NOTE — Consult Note (Signed)
Referring Physician: Delo    Chief Complaint: Left sided numbness  HPI: Martin Schneider is an 58 y.o. male who reports that yesterday he awakened with left sided numbness.  His speech was slurred and he was having difficulty drinking.  This resolved by about 6pm and when he went to bed he was at baseline.  When e awakened today again his left side was numb and he was having slurred speech.  He decided to present for evaluation when it did not resolve but by the time he was at the hospital his symptoms had again resolved and reports that now he is at baseline again.    Date last known well: Date: 08/27/2013 Time last known well: Time: 21:00 tPA Given: No: Resolution of symptoms  Past Medical History  Diagnosis Date  . Hypertension     Past Surgical History  Procedure Laterality Date  . Cholecystectomy    . Hernia repair    . Appendectomy      Family History  Problem Relation Age of Onset  . Renal cancer Father   . Hypertension Father   . Alzheimer's disease Father   . Kidney failure Mother   . Diabetes Mother    Social History:  reports that he has been smoking Cigarettes.  He has been smoking about 1.00 pack per day. He does not have any smokeless tobacco history on file. He reports that he does not drink alcohol. His drug history is not on file.  Allergies:  Allergies  Allergen Reactions  . Codeine     REACTION: HA    Medications: I have reviewed the patient's current medications. Prior to Admission:   Current outpatient prescriptions: amLODipine (NORVASC) 10 MG tablet, Take 1 tablet (10 mg total) by mouth daily., Disp: 30 tablet, Rfl: 0 ASA 81mg  daily MVI  ROS: History obtained from the patient  General ROS: negative for - chills, fatigue, fever, night sweats, weight gain or weight loss Psychological ROS: negative for - behavioral disorder, hallucinations, memory difficulties, mood swings or suicidal ideation Ophthalmic ROS: negative for - blurry vision, double  vision, eye pain or loss of vision ENT ROS: negative for - epistaxis, nasal discharge, oral lesions, sore throat, tinnitus or vertigo Allergy and Immunology ROS: negative for - hives or itchy/watery eyes Hematological and Lymphatic ROS: negative for - bleeding problems, bruising or swollen lymph nodes Endocrine ROS: negative for - galactorrhea, hair pattern changes, polydipsia/polyuria or temperature intolerance Respiratory ROS: negative for - cough, hemoptysis, shortness of breath or wheezing Cardiovascular ROS: negative for - chest pain, dyspnea on exertion, edema or irregular heartbeat Gastrointestinal ROS: negative for - abdominal pain, diarrhea, hematemesis, nausea/vomiting or stool incontinence Genito-Urinary ROS: negative for - dysuria, hematuria, incontinence or urinary frequency/urgency Musculoskeletal ROS: negative for - joint swelling or muscular weakness Neurological ROS: as noted in HPI Dermatological ROS: facial skin lesions due to skin cancer  Physical Examination: Blood pressure 147/77, pulse 58, temperature 97.8 F (36.6 C), resp. rate 18, SpO2 100.00%.  Neurologic Examination: Mental Status: Alert, oriented, thought content appropriate.  Speech fluent without evidence of aphasia.  Able to follow 3 step commands without difficulty. Cranial Nerves: II: Discs flat bilaterally; Visual fields grossly normal, pupils equal, round, reactive to light and accommodation III,IV, VI: ptosis not present, extra-ocular motions intact bilaterally V,VII: smile symmetric, facial light touch sensation normal bilaterally VIII: hearing normal bilaterally IX,X: gag reflex present XI: bilateral shoulder shrug XII: midline tongue extension Motor: Right : Upper extremity   5/5  Left:     Upper extremity   5/5  Lower extremity   5/5     Lower extremity   5/5 Tone and bulk:normal tone throughout; no atrophy noted Sensory: Pinprick and light touch intact throughout, bilaterally Deep Tendon  Reflexes: 2+ and symmetric throughout Plantars: Right: downgoing   Left: downgoing Cerebellar: normal finger-to-nose and normal heel-to-shin testing Gait: Unable to test CV: pulses palpable throughout     Laboratory Studies:  Basic Metabolic Panel:  Recent Labs Lab 08/28/13 1400  NA 141  K 4.9  CL 106  CO2 20  GLUCOSE 84  BUN 28*  CREATININE 2.64*  CALCIUM 8.9    Liver Function Tests:  Recent Labs Lab 08/28/13 1400  AST 11  ALT 9  ALKPHOS 78  BILITOT 0.3  PROT 7.0  ALBUMIN 3.9   No results found for this basename: LIPASE, AMYLASE,  in the last 168 hours No results found for this basename: AMMONIA,  in the last 168 hours  CBC:  Recent Labs Lab 08/28/13 1400  WBC 10.4  NEUTROABS 7.3  HGB 12.2*  HCT 36.4*  MCV 103.1*  PLT 268    Cardiac Enzymes: No results found for this basename: CKTOTAL, CKMB, CKMBINDEX, TROPONINI,  in the last 168 hours  BNP: No components found with this basename: POCBNP,   CBG:  Recent Labs Lab 08/28/13 1721  [place]    Microbiology: No results found for this or any previous visit.  Coagulation Studies:  Recent Labs  08/28/13 1400  LABPROT 13.0  INR 0.98    Urinalysis: No results found for this basename: COLORURINE, APPERANCEUR, LABSPEC, PHURINE, GLUCOSEU, HGBUR, BILIRUBINUR, KETONESUR, PROTEINUR, UROBILINOGEN, NITRITE, LEUKOCYTESUR,  in the last 168 hours  Lipid Panel: No results found for this basename: chol, trig, hdl, cholhdl, vldl, ldlcalc    HgbA1C:  No results found for this basename: HGBA1C    Urine Drug Screen:   No results found for this basename: labopia, cocainscrnur, labbenz, amphetmu, thcu, labbarb    Alcohol Level: No results found for this basename: ETH,  in the last 168 hours  Other results: EKG: normal sinus rhythm at 71 bpm.  Imaging: Ct Head (brain) Wo Contrast  08/28/2013   CLINICAL DATA:  Slurred speech.  Left arm and leg numbness.  EXAM: CT HEAD WITHOUT CONTRAST  TECHNIQUE:  Contiguous axial images were obtained from the base of the skull through the vertex without intravenous contrast.  COMPARISON:  08/08/2013  FINDINGS: The brainstem and cerebellum appear normal. 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 No sign of acute infarction, mass lesion, hemorrhage, hydrocephalus or extra-axial collection. The calvarium is unremarkable. No significant sinus disease.  IMPRESSION: No change since the previous study. 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 No acute finding.   Electronically Signed   By: Nelson Chimes M.D.   On: 08/28/2013 14:25    Assessment: 58 y.o. male presenting with episodes of left sided numbness, slurred speech and facial droop.  Symptoms have now resolved.  Head CT reviewed and shows no acute changes.  Concern is for TIA.  Further work up recommended.   Stroke Risk Factors - hypertension and smoking  Plan: 1. HgbA1c, fasting lipid panel 2. MRI, MRA  of the brain without contrast 3. PT consult, OT consult, Speech consult 4. Echocardiogram 5. Carotid dopplers 6. Prophylactic therapy-Antiplatelet med: Plavix - dose 75mg  daily 7. Smoking cessation couseling 8. Telemetry monitoring 9. Frequent neuro checks  Case discussed with Dr. Rivka Barbara, MD [facility] [phone] 08/28/2013, 6:39 PM

## 2013-08-28 NOTE — ED Notes (Signed)
Meal tray ordered per admitting MD. Pt has passed swallow screen.

## 2013-08-29 ENCOUNTER — Observation Stay (HOSPITAL_COMMUNITY): Payer: BC Managed Care – PPO

## 2013-08-29 LAB — BASIC METABOLIC PANEL
Anion gap: 13 (ref 5–15)
BUN: 33 mg/dL — ABNORMAL HIGH (ref 6–23)
CO2: 20 mEq/L (ref 19–32)
Calcium: 8.6 mg/dL (ref 8.4–10.5)
Chloride: 108 mEq/L (ref 96–112)
Creatinine, Ser: 2.6 mg/dL — ABNORMAL HIGH (ref 0.50–1.35)
GFR, EST AFRICAN AMERICAN: 30 mL/min — AB (ref >90)
GFR, EST NON AFRICAN AMERICAN: 26 mL/min — AB (ref >90)
GLUCOSE: 92 mg/dL (ref 70–99)
POTASSIUM: 5 meq/L (ref 3.7–5.3)
SODIUM: 141 meq/L (ref 137–147)

## 2013-08-29 LAB — LIPID PANEL
CHOL/HDL RATIO: 4.5 ratio
CHOLESTEROL: 152 mg/dL (ref 0–200)
HDL: 34 mg/dL — ABNORMAL LOW (ref >39)
LDL Cholesterol: 93 mg/dL (ref 0–99)
Triglycerides: 125 mg/dL (ref <150)
VLDL: 25 mg/dL (ref 0–40)

## 2013-08-29 LAB — HEMOGLOBIN A1C
HEMOGLOBIN A1C: 5.7 % — AB (ref <5.7)
MEAN PLASMA GLUCOSE: 117 mg/dL — AB (ref <117)

## 2013-08-29 NOTE — Progress Notes (Signed)
  Echocardiogram 2D Echocardiogram has been performed.  Diamond Nickel 08/29/2013, 3:36 PM

## 2013-08-29 NOTE — Progress Notes (Signed)
Stroke Team Progress Note  HISTORY Martin Schneider is an 58 y.o. male who reports that yesterday 08/27/2013 he awakened with left sided numbness. His speech was slurred and he was having difficulty drinking. This resolved by about 6pm and when he went to bed he was at baseline. When e awakened today 08/28/2013 again his left side was numb and he was having slurred speech. He decided to present for evaluation when it did not resolve but by the time he was at the hospital his symptoms had again resolved and reports that now he is at baseline again. He was last known well 08/27/2013 at 21:00. Patient was not administered TPA secondary to  Resolution of symptoms. He was admitted for further evaluation and treatment.  SUBJECTIVE No family is at the bedside.  Overall he feels his condition is completely resolved. Ct head is unremarkable and MRI is not done yet  OBJECTIVE Most recent Vital Signs: Filed Vitals:   08/29/13 0203 08/29/13 0300 08/29/13 0518 08/29/13 0702  BP: 152/81 133/66 152/84 131/61  Pulse: 78 58 58 59  Temp: 98.2 F (36.8 C) 98.2 F (36.8 C) 97.9 F (36.6 C) 97.7 F (36.5 C)  TempSrc: Oral Oral Oral Oral  Resp: 18 16 16 18   Height:      Weight:      SpO2: 100% 97% 100% 100%   CBG (last 3)   Recent Labs  08/28/13 1721  GLUCAP 89    IV Fluid Intake:   . sodium chloride 75 mL/hr at 08/28/13 2219    MEDICATIONS  . aspirin EC  325 mg Oral Daily  . heparin  5,000 Units Subcutaneous 3 times per day  . nicotine  21 mg Transdermal QHS  . pneumococcal 23 valent vaccine  0.5 mL Intramuscular Tomorrow-1000   PRN:  senna-docusate  Diet:  Cardiac thin liquids Activity:  OOB with assistance DVT Prophylaxis:  Heparin 5000 units sq tid   CLINICALLY SIGNIFICANT STUDIES Basic Metabolic Panel:   Recent Labs Lab 08/28/13 1400 08/29/13 0721  NA 141 141  K 4.9 5.0  CL 106 108  CO2 20 20  GLUCOSE 84 92  BUN 28* 33*  CREATININE 2.64* 2.60*  CALCIUM 8.9 8.6   Liver Function  Tests:   Recent Labs Lab 08/28/13 1400  AST 11  ALT 9  ALKPHOS 78  BILITOT 0.3  PROT 7.0  ALBUMIN 3.9   CBC:   Recent Labs Lab 08/28/13 1400  WBC 10.4  NEUTROABS 7.3  HGB 12.2*  HCT 36.4*  MCV 103.1*  PLT 268   Coagulation:   Recent Labs Lab 08/28/13 1400  LABPROT 13.0  INR 0.98   Cardiac Enzymes: No results found for this basename: CKTOTAL, CKMB, CKMBINDEX, TROPONINI,  in the last 168 hours Urinalysis: No results found for this basename: COLORURINE, APPERANCEUR, LABSPEC, PHURINE, GLUCOSEU, HGBUR, BILIRUBINUR, KETONESUR, PROTEINUR, UROBILINOGEN, NITRITE, LEUKOCYTESUR,  in the last 168 hours Lipid Panel    Component Value Date/Time   CHOL 152 08/29/2013 0721   TRIG 125 08/29/2013 0721   HDL 34* 08/29/2013 0721   CHOLHDL 4.5 08/29/2013 0721   VLDL 25 08/29/2013 0721   LDLCALC 93 08/29/2013 0721   HgbA1C  No results found for this basename: HGBA1C    Urine Drug Screen:   No results found for this basename: labopia,  cocainscrnur,  labbenz,  amphetmu,  thcu,  labbarb    Alcohol Level: No results found for this basename: ETH,  in the last 168 hours  CT  of the brain  08/28/2013    No change since the previous study. 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 No acute finding.     MRI of the brain  pending  MRA of the brain  pending  Carotid Doppler  pending  2D Echocardiogram  pending  CXR  08/29/2013    Low lung volumes without radiographic evidence of acute cardiopulmonary disease.   EKG  normal sinus rhythm. For complete results please see formal report.   Therapy Recommendations no PT or OT needs  Physical Exam   Pleasant middle aged male not in distress.Awake alert. Afebrile. Head is nontraumatic. Right eye dense cataract and blind.Neck is supple without bruit. Hearing is normal. Cardiac exam no murmur or gallop. Lungs are clear to auscultation. Distal pulses are well felt. Neurological Exam :  Awake  Alert oriented x 3. Normal  speech and language.eye movements full without nystagmus.fundi were not visualized.Fundi were not visualized. 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 Hearing is normal. Palatal movements are normal. Face symmetric. Tongue midline. Normal strength, tone, reflexes and coordination. Normal sensation. Gait deferred. ASSESSMENT Mr. Martin Schneider is a 58 y.o. male presenting with left sided numbness. Imaging pending. Suspect a subcortical infarct vs TIA.  On aspirin 81 mg orally every day prior to admission. Now on aspirin 325 mg orally every day for secondary stroke prevention. Patient with no resultant neuro deficits. Stroke work up underway.  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  F/u 2D, carotid doppler, therapy evals  Burnetta Sabin, MSN, RN, ANVP-BC, ANP-BC, GNP-BC [facility] Pager: [phone] 08/29/2013 10:27 AM I have personally examined this patient, reviewed notes, independently viewed imaging studies, participated in medical decision making and plan of care. I have made any additions or clarifications directly to the above note. Agree with note above. 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  Antony Contras, MD Medical Director [place] Pager: [phone] 08/29/2013 2:51 PM       To contact Stroke Continuity provider, please refer to [email or web address] After hours, contact General Neurology

## 2013-08-29 NOTE — Evaluation (Signed)
Physical Therapy Evaluation Patient Details Name: Martin Schneider MRN: [id number] DOB: 07-04-1955 Today's Date: 08/29/2013   History of Present Illness  pt presents with L sided numbness and slurred speech, now resolved.  pt being worked up for TIA.    Clinical Impression  Pt symptoms have resolved and pt now at baseline level of mobility.  Pt able to step over bathtub, don/doff socks, and demos good UE strength; no acute OT needs at this time.  Pt demos great balance with all mobility and no further acute PT needs at this time.  Pt ed on signs/symptoms of CVA.  Will sign off of acute PT.      Follow Up Recommendations No PT follow up    Equipment Recommendations  None recommended by PT    Recommendations for Other Services       Precautions / Restrictions Precautions Precautions: None Restrictions Weight Bearing Restrictions: No      Mobility  Bed Mobility Overal bed mobility: Independent                Transfers Overall transfer level: Independent Equipment used: None                Ambulation/Gait Ambulation/Gait assistance: Independent Ambulation Distance (Feet): 500 Feet Assistive device: None Gait Pattern/deviations: WFL(Within Functional Limits)   Gait velocity interpretation: at or above normal speed for age/gender    Stairs Stairs: Yes Stairs assistance: Independent Stair Management: No rails;Forwards;Alternating pattern Number of Stairs: 5    Wheelchair Mobility    Modified Rankin (Stroke Patients Only) Modified Rankin (Stroke Patients Only) Pre-Morbid Rankin Score: No symptoms Modified Rankin: No symptoms     Balance Overall balance assessment: Independent                               Standardized Balance Assessment Standardized Balance Assessment : Dynamic Gait Index   Dynamic Gait Index Level Surface: Normal Change in Gait Speed: Normal Gait with Horizontal Head Turns: Normal Gait with Vertical Head Turns:  Normal Gait and Pivot Turn: Normal Step Over Obstacle: Normal Step Around Obstacles: Normal Steps: Normal Total Score: 24       Pertinent Vitals/Pain Denied pain.      Home Living Family/patient expects to be discharged to:: Private residence Living Arrangements: Spouse/significant other Available Help at Discharge: Available PRN/intermittently Type of Home: House Home Access: Stairs to enter Entrance Stairs-Rails: Right Entrance Stairs-Number of Steps: 4 Home Layout: One level Home Equipment: None      Prior Function Level of Independence: Independent         Comments: Works in Electronics engineer        Extremity/Trunk Assessment   Upper Extremity Assessment: Overall WFL for tasks assessed           Lower Extremity Assessment: Overall WFL for tasks assessed      Cervical / Trunk Assessment: Normal  Communication   Communication: No difficulties  Cognition Arousal/Alertness: Awake/alert Behavior During Therapy: WFL for tasks assessed/performed Overall Cognitive Status: Within Functional Limits for tasks assessed                      General Comments      Exercises        Assessment/Plan    PT Assessment Patent does not need any further PT services  PT Diagnosis     PT Problem List  PT Treatment Interventions     PT Goals (Current goals can be found in the Care Plan section) Acute Rehab PT Goals PT Goal Formulation: No goals set, d/c therapy    Frequency     Barriers to discharge        Co-evaluation               End of Session Equipment Utilized During Treatment: Gait belt Activity Tolerance: Patient tolerated treatment well Patient left: in bed;with call bell/phone within reach Nurse Communication: Mobility status    Functional Assessment Tool Used: Clinical Judgement Functional Limitation: Mobility: Walking and moving around Mobility: Walking and Moving Around Current Status (Y1856): 0 percent  impaired, limited or restricted Mobility: Walking and Moving Around Goal Status [phone]): 0 percent impaired, limited or restricted Mobility: Walking and Moving Around Discharge Status [phone]): 0 percent impaired, limited or restricted    Time: 0756-0815 PT Time Calculation (min): 19 min   Charges:   PT Evaluation $Initial PT Evaluation Tier I: 1 Procedure PT Treatments $Gait Training: 8-22 mins   PT G Codes:   Functional Assessment Tool Used: Clinical Judgement Functional Limitation: Mobility: Walking and moving around    StuckeyThornton Papas, [place] 08/29/2013, 8:18 AM

## 2013-08-29 NOTE — Progress Notes (Signed)
[facility]                                                                              Patient Demographics  Martin Schneider, is a 58 y.o. male, DOB - 08/27/1955, JHE:[id number]  Admit date - 08/28/2013   Admitting Physician Cristal Ford, DO  Outpatient Primary MD for the patient is Dwan Bolt, MD  LOS - 1   Chief Complaint  Patient presents with  . Aphasia  . Numbness      HPI on 08/28/2013 Martin Schneider is a 58 y.o. male with a history of hypertension, recent admission for acute kidney injury, that presented to the emergency department with complaints of left arm and leg numbness and expressive aphasia. Patient stated the last couple of mornings he woke up with the symptoms however as the day progressed his symptoms began to resolve. Patient woke up on the morning of admission, with similar complaints and he came to emergency department. Patient stated he takes a daily aspirin. Patient denied any history of CVA. Patient was admitted approximately 2 weeks ago for kidney problems, and was supposed to see Dr. Posey Pronto, nephrology, for followup appointment. Patient denied any recent illness, fevers, chills, sick contacts, or recent travel.   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 (Patient does not recall having a CVA)  -Pending MRI/MRA of the brain, carotid Doppler, echocardiogram, PT OT evaluations, HbA1c -Neurology consulted and appreciated -Continue aspirin -LDL 93  Acute kidney injury  -Patient recently admitted to ago for acute kidney injury, with a creatinine of 5  -Creatinine currently trending downward, however may have plateaued at 2 point  -Dr. Posey Pronto, nephrology, consulted  Tobacco abuse  -Continue nicotine patch -Patient counseled smoking cessation   Hypertension  -Will allow permissive hypertension  given his symptoms  -Hold Amlodipine   Basal cell carcinoma  -Patient sees a dermatologist as an outpatient  Code Status: Full  Family Communication: None at bedside  Disposition Plan: Admitted, pending CVA workup  Time Spent in minutes   30 minutes  Procedures  None  Consults   Neurology Nephrology  DVT Prophylaxis  heparin  Lab Results  Component Value Date   PLT 268 08/28/2013    Medications  Scheduled Meds: . aspirin EC  325 mg Oral Daily  . heparin  5,000 Units Subcutaneous 3 times per day  . nicotine  21 mg Transdermal QHS  . pneumococcal 23 valent vaccine  0.5 mL Intramuscular Tomorrow-1000   Continuous Infusions: . sodium chloride 75 mL/hr at 08/28/13 2219   PRN Meds:.senna-docusate  Antibiotics   Anti-infectives   None      Subjective:   Willeen Niece seen and examined today.  Patient denies having a similar symptoms in the previous few mornings or any problems with his speech. Patient denies any chest pain, dizziness, shortness of breath, and headache, abdominal pain at this time.  Objective:   Filed Vitals:   08/29/13 0300 08/29/13 0518 08/29/13 0702 08/29/13 1032  BP: 133/66 152/84 131/61 149/76  Pulse: 58 58 59 61  Temp: 98.2 F (36.8 C) 97.9 F (36.6 C) 97.7 F (36.5 C) 98.1 F (36.7 C)  TempSrc: Oral Oral Oral Oral  Resp: 16 16 18 18   Height:      Weight:      SpO2: 97% 100% 100% 100%    Wt Readings from Last 3 Encounters:  08/28/13 79.379 kg (175 lb)  08/10/13 77.973 kg (171 lb 14.4 oz)  09/06/09 76.204 kg (168 lb)     Intake/Output Summary (Last 24 hours) at 08/29/13 1333 Last data filed at 08/29/13 1000  Gross per 24 hour  Intake    120 ml  Output    700 ml  Net   -580 ml    Exam General: Well developed, well nourished, NAD, appears stated age  HEENT: NCAT, scab noted near right ear, nasal bridge, mucous membranes moist. Lesion noted on Right ear, poor dentition  Cardiovascular: S1 S2 auscultated, no rubs, murmurs  or gallops. Regular rate and rhythm.  Respiratory: Clear to auscultation bilaterally with equal chest rise  Abdomen: Soft, nontender, nondistended, + bowel sounds  Extremities: warm dry without cyanosis clubbing or edema  Neuro: AAOx3, No focal deficits Skin: Without rashes exudates or nodules  Psych: Normal affect and demeanor with intact judgement and insight  Data Review   Micro Results No results found for this or any previous visit (from the past 240 hour(s)).  Radiology Reports Dg Chest 2 View  08/29/2013   CLINICAL DATA:  Stroke.  EXAM: CHEST  2 VIEW  COMPARISON:  No priors.  FINDINGS: Lung volumes are low. No consolidative airspace disease. No pleural effusions. No pneumothorax. No pulmonary nodule or mass noted. Pulmonary vasculature and the cardiomediastinal silhouette are within normal limits. Surgical clips project over the right upper quadrant of the abdomen, presumably from prior cholecystectomy.  IMPRESSION: Low lung volumes without radiographic evidence of acute cardiopulmonary disease.   Electronically Signed   By: Vinnie Langton M.D.   On: 08/29/2013 08:18   Ct Head (brain) Wo Contrast  08/28/2013   CLINICAL DATA:  Slurred speech.  Left arm and leg numbness.  EXAM: CT HEAD WITHOUT CONTRAST  TECHNIQUE: Contiguous axial images were obtained from the base of the skull through the vertex without intravenous contrast.  COMPARISON:  08/08/2013  FINDINGS: The brainstem and cerebellum appear normal. 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 No sign of acute infarction, mass lesion, hemorrhage, hydrocephalus or extra-axial collection. The calvarium is unremarkable. No significant sinus disease.  IMPRESSION: No change since the previous study. 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 No acute finding.   Electronically Signed   By: Nelson Chimes M.D.   On: 08/28/2013 14:25   Ct Head Wo Contrast  08/08/2013   CLINICAL  DATA:  Blurred vision  EXAM: CT HEAD WITHOUT CONTRAST  TECHNIQUE: Contiguous axial images were obtained from the base of the skull through the vertex without intravenous contrast.  COMPARISON:  None.  FINDINGS: Mild atrophy. Chronic microvascular ischemic change in the white matter. Chronic ischemia head of caudate and left basal ganglia.  Negative for acute infarct. Negative for hemorrhage or mass. Calvarium intact.  Mild mucosal edema in the paranasal sinuses.  IMPRESSION: Chronic ischemic change.  No acute abnormality.   Electronically Signed   By: Franchot Gallo M.D.   On: 08/08/2013 17:08   US Renal  08/08/2013   CLINICAL DATA:  Left-sided flank pain with elevated renal functions  EXAM: RENAL/URINARY TRACT  ULTRASOUND COMPLETE  COMPARISON:  None.  FINDINGS: Right Kidney:  Length: 9.2 cm. Multiple cystic lesions are identified throughout the right kidney. The largest of these lies in the upper pole measuring 2.5 cm.  Left Kidney:  Length: 8.9 cm. Multiple cysts are noted throughout the left kidney. The largest of these measures 4.8 cm within the lower pole of the left kidney. The bladder is partially distended.  Bladder:  Appears normal for degree of bladder distention.  IMPRESSION: Bilateral renal cystic change.  No obstructive changes are noted.   Electronically Signed   By: Inez Catalina M.D.   On: 08/08/2013 16:17    CBC  Recent Labs Lab 08/28/13 1400  WBC 10.4  HGB 12.2*  HCT 36.4*  PLT 268  MCV 103.1*  MCH 34.6*  MCHC 33.5  RDW 13.7  LYMPHSABS 2.1  MONOABS 0.6  EOSABS 0.4  BASOSABS 0.1    Chemistries   Recent Labs Lab 08/28/13 1400 08/29/13 0721  NA 141 141  K 4.9 5.0  CL 106 108  CO2 20 20  GLUCOSE 84 92  BUN 28* 33*  CREATININE 2.64* 2.60*  CALCIUM 8.9 8.6  AST 11  --   ALT 9  --   ALKPHOS 78  --   BILITOT 0.3  --    ------------------------------------------------------------------------------------------------------------------ estimated creatinine clearance is  30.7 ml/min (by C-G formula based on Cr of 2.6). ------------------------------------------------------------------------------------------------------------------ No results found for this basename: HGBA1C,  in the last 72 hours ------------------------------------------------------------------------------------------------------------------  Recent Labs  08/29/13 0721  CHOL 152  HDL 34*  LDLCALC 93  TRIG 125  CHOLHDL 4.5   ------------------------------------------------------------------------------------------------------------------ No results found for this basename: TSH, T4TOTAL, FREET3, T3FREE, THYROIDAB,  in the last 72 hours ------------------------------------------------------------------------------------------------------------------ No results found for this basename: VITAMINB12, FOLATE, FERRITIN, TIBC, IRON, RETICCTPCT,  in the last 72 hours  Coagulation profile  Recent Labs Lab 08/28/13 1400  INR 0.98    No results found for this basename: DDIMER,  in the last 72 hours  Cardiac Enzymes No results found for this basename: CK, CKMB, TROPONINI, MYOGLOBIN,  in the last 168 hours ------------------------------------------------------------------------------------------------------------------ No components found with this basename: POCBNP,     Edom Schmuhl D.O. on 08/29/2013 at 1:33 PM  Between 7am to 7pm - Pager - [phone]  After 7pm go to [email or web address] - password [email or web address]  And look for the night coverage person covering for me after hours  [facility] Office  [phone]

## 2013-08-29 NOTE — Progress Notes (Signed)
OT Cancellation Note  Patient Details Name: Martin Schneider MRN: [id number] DOB: [birth year]   Cancelled Treatment:    Reason Eval/Treat Not Completed: Other (comment). Per PT communication, pt currently back to baseline functioning and able to don/doff socks and step over bathtub during PT session. PT noted no vision concerns. Pt has no OT needs at this time. Please re-refer if new concerns arise.   [name] [phone] 08/29/2013, 8:31 AM

## 2013-08-29 NOTE — Evaluation (Signed)
Speech Language Pathology Evaluation Patient Details Name: Martin Schneider MRN: [id number] DOB: [birth year] Today's Date: 08/29/2013 Time: 3893-7342 SLP Time Calculation (min): 20 min  Problem List:  Patient Active Problem List   Diagnosis Date Noted  . TIA (transient ischemic attack) 08/28/2013  . Acute renal failure 08/08/2013  . Skin cancer 08/08/2013  . HYPERTENSION 09/06/2009  . 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 Hypertension   . Kidney disease    Past Surgical History:  Past Surgical History  Procedure Laterality Date  . Cholecystectomy    . Hernia repair    . Appendectomy     HPI:  pt presents with L sided numbness and slurred speech, now resolved.  pt being worked up for TIA.      Assessment / Plan / Recommendation Clinical Impression  Patient's previous symtoms of dysarthria, left sided numbness and weakness have resolved.  Patient appears to be at baseline level of functioning.  No further ST is needed at this time.    SLP Assessment  Patient does not need any further Speech Lanaguage Pathology Services    Follow Up Recommendations       Frequency and Duration        Pertinent Vitals/Pain n/a       SLP Evaluation Prior Functioning  Cognitive/Linguistic Baseline: Within functional limits Type of Home: House Available Help at Discharge: Available PRN/intermittently Vocation: Full time employment Engineer, materials)   Cognition  Overall Cognitive Status: Within Functional Limits for tasks assessed Arousal/Alertness: Awake/alert Orientation Level: Oriented X4 Memory: Appears intact Awareness: Appears intact Problem Solving: Appears intact    Comprehension  Auditory Comprehension Overall Auditory Comprehension: Appears within functional limits for tasks assessed Yes/No Questions: Within Functional Limits Commands: Within Functional Limits Conversation: Complex Reading Comprehension Reading  Status: Within funtional limits    Expression Expression Primary Mode of Expression: Verbal Verbal Expression Overall Verbal Expression: Appears within functional limits for tasks assessed Initiation: No impairment Repetition: No impairment Naming: No impairment Pragmatics: No impairment   Oral / Motor Oral Motor/Sensory Function Overall Oral Motor/Sensory Function: Appears within functional limits for tasks assessed Motor Speech Overall Motor Speech: Appears within functional limits for tasks assessed Respiration: Within functional limits Phonation: Normal Resonance: Within functional limits Articulation: Within functional limitis Intelligibility: Intelligible   GO Functional Assessment Tool Used: Clinical judgment Functional Limitations: Other Speech Language Pathology Spoken Language Comprehension Current Status [phone]): 0 percent impaired, limited or restricted Spoken Language Comprehension Goal Status (L5726): 0 percent impaired, limited or restricted Spoken Language Comprehension Discharge Status [phone]): 0 percent impaired, limited or restricted Other Speech-Language Pathology Functional Limitation [phone]): 0 percent impaired, limited or restricted   Quinn Axe T 08/29/2013, 10:26 AM

## 2013-08-29 NOTE — Progress Notes (Signed)
*  PRELIMINARY RESULTS* Vascular Ultrasound Carotid Duplex (Doppler) has been completed. Findings suggest 1-39% internal carotid artery stenosis bilaterally. Vertebral arteries are patent with antegrade flow.  08/29/2013 4:15 PM Maudry Mayhew, RVT, RDCS, RDMS

## 2013-08-30 DIAGNOSIS — I6350 Cerebral infarction due to unspecified occlusion or stenosis of unspecified cerebral artery: Principal | ICD-10-CM

## 2013-08-30 DIAGNOSIS — I639 Cerebral infarction, unspecified: Secondary | ICD-10-CM | POA: Diagnosis present

## 2013-08-30 MED ORDER — NICOTINE 21 MG/24HR TD PT24: 21.0000 mg | MEDICATED_PATCH | Freq: Every day | TRANSDERMAL | Status: DC

## 2013-08-30 MED ORDER — ASPIRIN 325 MG PO TBEC: 325.0000 mg | DELAYED_RELEASE_TABLET | Freq: Every day | ORAL | Status: AC

## 2013-08-30 NOTE — Discharge Summary (Signed)
Physician Discharge Summary  Martin Schneider UMP:[id number] DOB: September 01, 1955 DOA: 08/28/2013  PCP: Dwan Bolt, MD  Admit date: 08/28/2013 Discharge date: 08/30/2013  Time spent: 45 minutes  Recommendations for Outpatient Follow-up:  Patient will be discharged home. He is to followup with his primary care physician within one week of discharge. Patient will also need followup with neurology, Dr. Erlinda Hong within 4 weeks of discharge. Patient should also follow up with his dermatologist as well as nephrology. Patient to continue taking his medications as prescribed. Patient may resume normal physical activity as tolerated. Patient should follow a heart healthy diet.  Discharge Diagnoses:  Acute CVA/left-sided weakness and expressive aphasia Acute kidney injury/ CKD stage 4 Tobacco abuse Hypertension Basal carcinoma  Discharge Condition: Stable   Diet recommendation: Heart healthy  Filed Weights   08/28/13 2023 08/28/13 2112  Weight: 77.973 kg (171 lb 14.4 oz) 79.379 kg (175 lb)    History of present illness:  on 08/28/2013  Martin Schneider is a 58 y.o. male with a history of hypertension, recent admission for acute kidney injury, that presented to the emergency department with complaints of left arm and leg numbness and expressive aphasia. Patient stated the last couple of mornings he woke up with the symptoms however as the day progressed his symptoms began to resolve. Patient woke up on the morning of admission, with similar complaints and he came to emergency department. Patient stated he takes a daily aspirin. Patient denied any history of CVA. Patient was admitted approximately 2 weeks ago for kidney problems, and was supposed to see Dr. Posey Pronto, nephrology, for followup appointment. Patient denied any recent illness, fevers, chills, sick contacts, or recent travel.   Hospital Course:  Acute CVA with Left-sided weakness and expressive aphasia -Patient side weakness and aphasia have both  resolved. 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 (Patient does not recall having a CVA)  -MRI: Acute infarct right posterior limb internal capsule -MRA: Moderate stenosis distal right vertebral artery. -Neurology consulted and appreciated and recommend outpatient followup with Dr. Erlinda Hong in 4 weeks as well as Aspirin 325mg  daily  -LDL 93 , HbA2c 5.7 (both below goal) -PT and OT were consulted and patient did not require any further needs at this time.  Acute kidney injury  -Patient recently admitted to ago for acute kidney injury, with a creatinine of 5  -Creatinine currently trending downward, however may have plateaued at 2 point  -Dr. Posey Pronto, nephrology, consulted  -Patient to continue to follow with Dr. Posey Pronto as an outpatient.  Tobacco abuse  -Continue nicotine patch  -Patient counseled smoking cessation   Hypertension  -Amlodipine been held during hospital course to allow for permissive hypertension -Patient may continue his amlodipine once discharged.  Basal cell carcinoma  -Patient sees a dermatologist as an outpatient  Procedures: Echocardiogram Study Conclusions - Left ventricle: The cavity size was normal. There was moderate concentric hypertrophy. Systolic function was vigorous. The estimated ejection fraction was in the range of 65% to 70%. Wall motion was normal; there were no regional wall motion abnormalities. - Aortic root: The aortic root was mildly dilated. - Left atrium: The atrium was mildly dilated.  Impressions: No cardiac source of emboli was indentified.  Carotid doppler Summary:Findings suggest 1-39% internal carotid artery stenosis bilaterally. Vertebral arteries are patent with antegrade flow.  Consultations: Neurology Nephrology  Discharge Exam: Filed Vitals:   08/30/13 0951  BP: 143/75  Pulse: 70  Temp: 97.5 F (36.4 C)  Resp: 20   Exam  General: Well developed,  well nourished, NAD, appears stated age  HEENT: NCAT, scab noted near right ear, nasal bridge, mucous membranes moist. Lesion noted on Right ear, poor dentition  Cardiovascular: S1 S2 auscultated, Regular rate and rhythm.  Respiratory: Clear to auscultation bilaterally with equal chest rise  Abdomen: Soft, nontender, nondistended, + bowel sounds  Extremities: warm dry without cyanosis clubbing or edema  Neuro: AAOx3, No focal deficits  Skin: Without rashes exudates or nodules  Psych: Normal affect and demeanor   Discharge Instructions      Discharge Instructions   Discharge instructions    Complete by:  As directed   Patient will be discharged home. He is to followup with his primary care physician within one week of discharge. Patient will also need followup with neurology, Dr. Erlinda Hong within 4 weeks of discharge. Patient should also follow up with his dermatologist as well as nephrology. Patient to continue taking his medications as prescribed. Patient may resume normal physical activity as tolerated. Patient should follow a heart healthy diet.            Medication List         amLODipine 10 MG tablet  Commonly known as:  NORVASC  Take 1 tablet (10 mg total) by mouth daily.     aspirin 325 MG EC tablet  Take 1 tablet (325 mg total) by mouth daily.     nicotine 21 mg/24hr patch  Commonly known as:  NICODERM CQ - dosed in mg/24 hours  Place 1 patch (21 mg total) onto the skin at bedtime.       Allergies  Allergen Reactions  . Codeine     REACTION: HA   Follow-up Information   Follow up with Ulla Potash., MD On 09/11/2013. (at 1:15PM)    Specialty:  Nephrology   Contact information:   [place] [address] [phone]       Follow up with Xu,Jindong, MD. Schedule an appointment as soon as possible for a visit in 1 month. [facility] followup, stroke clinic)    Specialty:  Neurology   Contact information:   [address] [place]  [address] [phone]       Follow up with Dwan Bolt, MD. Schedule an appointment as soon as possible for a visit in 1 week. [facility] followup)    Specialty:  Endocrinology   Contact information:   [address] [place] [address] [phone]        The results of significant diagnostics from this hospitalization (including imaging, microbiology, ancillary and laboratory) are listed below for reference.    Significant Diagnostic Studies: Dg Chest 2 View  08/29/2013   CLINICAL DATA:  Stroke.  EXAM: CHEST  2 VIEW  COMPARISON:  No priors.  FINDINGS: Lung volumes are low. No consolidative airspace disease. No pleural effusions. No pneumothorax. No pulmonary nodule or mass noted. Pulmonary vasculature and the cardiomediastinal silhouette are within normal limits. Surgical clips project over the right upper quadrant of the abdomen, presumably from prior cholecystectomy.  IMPRESSION: Low lung volumes without radiographic evidence of acute cardiopulmonary disease.   Electronically Signed   By: Vinnie Langton M.D.   On: 08/29/2013 08:18   Ct Head (brain) Wo Contrast  08/28/2013   CLINICAL DATA:  Slurred speech.  Left arm and leg numbness.  EXAM: CT HEAD WITHOUT CONTRAST  TECHNIQUE: Contiguous axial images were obtained from the  base of the skull through the vertex without intravenous contrast.  COMPARISON:  08/08/2013  FINDINGS: The brainstem and cerebellum appear normal. 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 No sign of acute infarction, mass lesion, hemorrhage, hydrocephalus or extra-axial collection. The calvarium is unremarkable. No significant sinus disease.  IMPRESSION: No change since the previous study. 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 No acute finding.   Electronically Signed   By: Nelson Chimes M.D.   On: 08/28/2013 14:25   Ct Head Wo Contrast  08/08/2013   CLINICAL DATA:   Blurred vision  EXAM: CT HEAD WITHOUT CONTRAST  TECHNIQUE: Contiguous axial images were obtained from the base of the skull through the vertex without intravenous contrast.  COMPARISON:  None.  FINDINGS: Mild atrophy. Chronic microvascular ischemic change in the white matter. Chronic ischemia head of caudate and left basal ganglia.  Negative for acute infarct. Negative for hemorrhage or mass. Calvarium intact.  Mild mucosal edema in the paranasal sinuses.  IMPRESSION: Chronic ischemic change.  No acute abnormality.   Electronically Signed   By: Franchot Gallo M.D.   On: 08/08/2013 17:08   Mr Brain Wo Contrast  08/29/2013   CLINICAL DATA:  Stroke. Left arm and leg numbness. Expressive aphasia.  EXAM: MRI HEAD WITHOUT CONTRAST  MRA HEAD WITHOUT CONTRAST  TECHNIQUE: Multiplanar, multiecho pulse sequences of the brain and surrounding structures were obtained without intravenous contrast. Angiographic images of the head were obtained using MRA technique without contrast.  COMPARISON:  CT 08/28/2013  FINDINGS: MRI HEAD FINDINGS  Acute infarct posterior limb internal capsule on the right measuring 1 cm. No other acute infarct identified at this time.  Mild chronic microvascular ischemic change in the white matter and pons. 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  Negative for hemorrhage or mass. No edema or shift of the midline structures.  Mild mucosal edema in the paranasal sinuses.  MRA HEAD FINDINGS  Both vertebral arteries are patent to the basilar. Moderate stenosis distal right vertebral artery. PICA patent bilaterally. Basilar artery is tortuous and patent. Superior cerebellar arteries are patent bilaterally.  Internal carotid artery is widely patent bilaterally. Anterior and middle cerebral arteries are normal bilaterally  Negative for cerebral aneurysm.  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  Mild to moderate chronic microvascular ischemic changes  Moderate stenosis distal right  vertebral artery. No other significant intracranial stenosis.   Electronically Signed   By: Franchot Gallo M.D.   On: 08/29/2013 16:35   US Renal  08/08/2013   CLINICAL DATA:  Left-sided flank pain with elevated renal functions  EXAM: RENAL/URINARY TRACT ULTRASOUND COMPLETE  COMPARISON:  None.  FINDINGS: Right Kidney:  Length: 9.2 cm. Multiple cystic lesions are identified throughout the right kidney. The largest of these lies in the upper pole measuring 2.5 cm.  Left Kidney:  Length: 8.9 cm. Multiple cysts are noted throughout the left kidney. The largest of these measures 4.8 cm within the lower pole of the left kidney. The bladder is partially distended.  Bladder:  Appears normal for degree of bladder distention.  IMPRESSION: Bilateral renal cystic change.  No obstructive changes are noted.   Electronically Signed   By: Inez Catalina M.D.   On: 08/08/2013 16:17   Mr Jodene Nam Head/brain Wo Cm  08/29/2013   CLINICAL DATA:  Stroke. Left arm and leg numbness. Expressive aphasia.  EXAM: MRI HEAD WITHOUT CONTRAST  MRA HEAD WITHOUT CONTRAST  TECHNIQUE: Multiplanar, multiecho pulse sequences of the brain and surrounding structures were obtained without intravenous contrast. Angiographic images of the head were obtained using MRA technique without contrast.  COMPARISON:  CT 08/28/2013  FINDINGS: MRI HEAD FINDINGS  Acute infarct posterior limb internal capsule on the right measuring 1 cm. No other acute infarct identified at this time.  Mild chronic microvascular ischemic change in the white matter and pons. 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  Negative for hemorrhage or mass. No edema or shift of the midline structures.  Mild mucosal edema in the paranasal sinuses.  MRA HEAD FINDINGS  Both vertebral arteries are patent to the basilar. Moderate stenosis distal right vertebral artery. PICA patent bilaterally. Basilar artery is tortuous and patent. Superior cerebellar arteries are patent bilaterally.  Internal  carotid artery is widely patent bilaterally. Anterior and middle cerebral arteries are normal bilaterally  Negative for cerebral aneurysm.  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  Mild to moderate chronic microvascular ischemic changes  Moderate stenosis distal right vertebral artery. No other significant intracranial stenosis.   Electronically Signed   By: Franchot Gallo M.D.   On: 08/29/2013 16:35    Microbiology: No results found for this or any previous visit (from the past 240 hour(s)).   Labs: Basic Metabolic Panel:  Recent Labs Lab 08/28/13 1400 08/29/13 0721  NA 141 141  K 4.9 5.0  CL 106 108  CO2 20 20  GLUCOSE 84 92  BUN 28* 33*  CREATININE 2.64* 2.60*  CALCIUM 8.9 8.6   Liver Function Tests:  Recent Labs Lab 08/28/13 1400  AST 11  ALT 9  ALKPHOS 78  BILITOT 0.3  PROT 7.0  ALBUMIN 3.9   No results found for this basename: LIPASE, AMYLASE,  in the last 168 hours No results found for this basename: AMMONIA,  in the last 168 hours CBC:  Recent Labs Lab 08/28/13 1400  WBC 10.4  NEUTROABS 7.3  HGB 12.2*  HCT 36.4*  MCV 103.1*  PLT 268   Cardiac Enzymes: No results found for this basename: CKTOTAL, CKMB, CKMBINDEX, TROPONINI,  in the last 168 hours BNP: BNP (last 3 results) No results found for this basename: PROBNP,  in the last 8760 hours CBG:  Recent Labs Lab 08/28/13 1721  [place]       Signed:  Kienna Moncada  [facility] 08/30/2013, 11:16 AM

## 2013-08-30 NOTE — Progress Notes (Signed)
Patient is discharged from room 4N21 at this time. Alert and in stable condition. IV site d/c'd as well as tele. Instructions read to patient and understanding verbalized. Left unit via wheelchair with belongings at side.

## 2013-08-30 NOTE — ED Provider Notes (Signed)
CSN: [id number]     Arrival date & time 08/28/13  1336 History   First MD Initiated Contact with Patient 08/28/13 1630     Chief Complaint  Patient presents with  . Aphasia  . Numbness     (Consider location/radiation/quality/duration/timing/severity/associated sxs/prior Treatment) HPI Comments: Patient is a 58 year old male with history of hypertension. He presents today with complaints of waking up yesterday and again this morning with slurred speech and numbness in the left arm and leg. This has been present upon waking for the past 2 days. Yesterday the symptoms resolved spontaneously, then returned this morning. He experienced these symptoms the majority of the day until his wife convinced him to present for evaluation. By the time he arrived to the ER, his symptoms have resolved. He has no other complaints at present. He denies any headache, fever, stiff neck. He denies any injury or trauma. There are no aggravating or alleviating factors. He has never experienced these symptoms before.  The history is provided by the patient.    Past Medical History  Diagnosis Date  . Hypertension   . Kidney disease    Past Surgical History  Procedure Laterality Date  . Cholecystectomy    . Hernia repair    . Appendectomy     Family History  Problem Relation Age of Onset  . Renal cancer Father   . Hypertension Father   . Alzheimer's disease Father   . Kidney failure Mother   . Diabetes Mother    History  Substance Use Topics  . Smoking status: Current Every Day Smoker -- 1.00 packs/day    Types: Cigarettes  . Smokeless tobacco: Not on file  . Alcohol Use: No    Review of Systems  All other systems reviewed and are negative.     Allergies  Codeine  Home Medications   Prior to Admission medications   Medication Sig Start Date End Date Taking? Authorizing Provider  amLODipine (NORVASC) 10 MG tablet Take 1 tablet (10 mg total) by mouth daily. 08/11/13  Yes Charlynne Cousins, MD   BP 147/75  Pulse 67  Temp(Src) 97.4 F (36.3 C) (Oral)  Resp 18  Ht 5\' 6"  (1.676 m)  Wt 175 lb (79.379 kg)  BMI 28.26 kg/m2  SpO2 98% Physical Exam  Nursing note and vitals reviewed. Constitutional: He is oriented to person, place, and time. He appears well-developed and well-nourished. No distress.  HENT:  Head: Normocephalic and atraumatic.  Mouth/Throat: Oropharynx is clear and moist.  Eyes: EOM are normal. Pupils are equal, round, and reactive to light.  Neck: Normal range of motion. Neck supple.  Cardiovascular: Normal rate, regular rhythm and normal heart sounds.   No murmur heard. Pulmonary/Chest: Effort normal and breath sounds normal. No respiratory distress. He has no wheezes.  Abdominal: Soft. Bowel sounds are normal. He exhibits no distension. There is no tenderness.  Musculoskeletal: Normal range of motion. He exhibits no edema.  Neurological: He is alert and oriented to person, place, and time. No cranial nerve deficit. He exhibits normal muscle tone. Coordination normal.  Skin: Skin is warm and dry. He is not diaphoretic.    ED Course  Procedures (including critical care time) Labs Review Labs Reviewed  CBC - Abnormal; Notable for the following:    RBC 3.53 (*)    Hemoglobin 12.2 (*)    HCT 36.4 (*)    MCV 103.1 (*)    MCH 34.6 (*)    All other components within normal limits  COMPREHENSIVE METABOLIC PANEL - Abnormal; Notable for the following:    BUN 28 (*)    Creatinine, Ser 2.64 (*)    GFR calc non Af Amer 25 (*)    GFR calc Af Amer 29 (*)    All other components within normal limits  HEMOGLOBIN A1C - Abnormal; Notable for the following:    Hemoglobin A1C 5.7 (*)    Mean Plasma Glucose 117 (*)    All other components within normal limits  LIPID PANEL - Abnormal; Notable for the following:    HDL 34 (*)    All other components within normal limits  BASIC METABOLIC PANEL - Abnormal; Notable for the following:    BUN 33 (*)     Creatinine, Ser 2.60 (*)    GFR calc non Af Amer 26 (*)    GFR calc Af Amer 30 (*)    All other components within normal limits  PROTIME-INR  APTT  DIFFERENTIAL  CBG MONITORING, ED  Randolm Idol, ED    Imaging Review Dg Chest 2 View  08/29/2013   CLINICAL DATA:  Stroke.  EXAM: CHEST  2 VIEW  COMPARISON:  No priors.  FINDINGS: Lung volumes are low. No consolidative airspace disease. No pleural effusions. No pneumothorax. No pulmonary nodule or mass noted. Pulmonary vasculature and the cardiomediastinal silhouette are within normal limits. Surgical clips project over the right upper quadrant of the abdomen, presumably from prior cholecystectomy.  IMPRESSION: Low lung volumes without radiographic evidence of acute cardiopulmonary disease.   Electronically Signed   By: Vinnie Langton M.D.   On: 08/29/2013 08:18   Ct Head (brain) Wo Contrast  08/28/2013   CLINICAL DATA:  Slurred speech.  Left arm and leg numbness.  EXAM: CT HEAD WITHOUT CONTRAST  TECHNIQUE: Contiguous axial images were obtained from the base of the skull through the vertex without intravenous contrast.  COMPARISON:  08/08/2013  FINDINGS: The brainstem and cerebellum appear normal. 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 No sign of acute infarction, mass lesion, hemorrhage, hydrocephalus or extra-axial collection. The calvarium is unremarkable. No significant sinus disease.  IMPRESSION: No change since the previous study. 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 No acute finding.   Electronically Signed   By: Nelson Chimes M.D.   On: 08/28/2013 14:25   Mr Brain Wo Contrast  08/29/2013   CLINICAL DATA:  Stroke. Left arm and leg numbness. Expressive aphasia.  EXAM: MRI HEAD WITHOUT CONTRAST  MRA HEAD WITHOUT CONTRAST  TECHNIQUE: Multiplanar, multiecho pulse sequences of the brain and surrounding structures were obtained without intravenous contrast. Angiographic  images of the head were obtained using MRA technique without contrast.  COMPARISON:  CT 08/28/2013  FINDINGS: MRI HEAD FINDINGS  Acute infarct posterior limb internal capsule on the right measuring 1 cm. No other acute infarct identified at this time.  Mild chronic microvascular ischemic change in the white matter and pons. 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  Negative for hemorrhage or mass. No edema or shift of the midline structures.  Mild mucosal edema in the paranasal sinuses.  MRA HEAD FINDINGS  Both vertebral arteries are patent to the basilar. Moderate stenosis distal right vertebral artery. PICA patent bilaterally. Basilar artery is tortuous and patent. Superior cerebellar arteries are patent bilaterally.  Internal carotid artery is widely patent bilaterally. Anterior and middle cerebral arteries are normal bilaterally  Negative for cerebral aneurysm.  IMPRESSION: Acute infarct right posterior limb  internal capsule.  Mild to moderate chronic microvascular ischemic changes  Moderate stenosis distal right vertebral artery. No other significant intracranial stenosis.   Electronically Signed   By: Franchot Gallo M.D.   On: 08/29/2013 16:35   Mr Jodene Nam Head/brain Wo Cm  08/29/2013   CLINICAL DATA:  Stroke. Left arm and leg numbness. Expressive aphasia.  EXAM: MRI HEAD WITHOUT CONTRAST  MRA HEAD WITHOUT CONTRAST  TECHNIQUE: Multiplanar, multiecho pulse sequences of the brain and surrounding structures were obtained without intravenous contrast. Angiographic images of the head were obtained using MRA technique without contrast.  COMPARISON:  CT 08/28/2013  FINDINGS: MRI HEAD FINDINGS  Acute infarct posterior limb internal capsule on the right measuring 1 cm. No other acute infarct identified at this time.  Mild chronic microvascular ischemic change in the white matter and pons. 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  Negative for hemorrhage or mass. No edema or shift of the midline structures.  Mild  mucosal edema in the paranasal sinuses.  MRA HEAD FINDINGS  Both vertebral arteries are patent to the basilar. Moderate stenosis distal right vertebral artery. PICA patent bilaterally. Basilar artery is tortuous and patent. Superior cerebellar arteries are patent bilaterally.  Internal carotid artery is widely patent bilaterally. Anterior and middle cerebral arteries are normal bilaterally  Negative for cerebral aneurysm.  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  Mild to moderate chronic microvascular ischemic changes  Moderate stenosis distal right vertebral artery. No other significant intracranial stenosis.   Electronically Signed   By: Franchot Gallo M.D.   On: 08/29/2013 16:35     EKG Interpretation   Date/Time:  Wednesday August 28 2013 14:03:04 EDT Ventricular Rate:  71 PR Interval:  144 QRS Duration: 82 QT Interval:  362 QTC Calculation: 393 R Axis:   -34 Text Interpretation:  Normal sinus rhythm Left axis deviation Nonspecific  T wave abnormality Abnormal ECG Confirmed by DELOS  MD, Tyneshia Stivers ([id number]) on  08/30/2013 1:33:51 AM      MDM   Final diagnoses:  Acute renal failure, unspecified acute renal failure type  Skin cancer  Unspecified essential hypertension  Left-sided weakness  Transient cerebral ischemia, unspecified transient cerebral ischemia type  Hemispheric carotid artery syndrome    Patient presents with episodes of left arm and leg numbness along with speech difficulty over each of the past 2 days. His workup thus far is unremarkable and neurologically he is intact. I discussed with neurology who recommends admission to internal medicine for stroke workup.    Veryl Speak, MD 08/30/13 [phone]

## 2013-08-30 NOTE — Progress Notes (Signed)
Stroke Team Progress Note  HISTORY Martin Schneider is an 58 y.o. male who reports that yesterday 08/27/2013 he awakened with left sided numbness. His speech was slurred and he was having difficulty drinking. This resolved by about 6pm and when he went to bed he was at baseline. When e awakened today 08/28/2013 again his left side was numb and he was having slurred speech. He decided to present for evaluation when it did not resolve but by the time he was at the hospital his symptoms had again resolved and reports that now he is at baseline again. He was last known well 08/27/2013 at 21:00. Patient was not administered TPA secondary to  Resolution of symptoms. He was admitted for further evaluation and treatment.  SUBJECTIVE The patient voices no complaints. There are no family members present. Discussed probable discharge today. Discussed smoking cessation.  OBJECTIVE Most recent Vital Signs: Filed Vitals:   08/29/13 1339 08/29/13 1808 08/29/13 1949 08/30/13 0200  BP: 145/77 148/84 147/75 145/84  Pulse: 64 62 67 63  Temp: 97.9 F (36.6 C) 97.8 F (36.6 C) 97.4 F (36.3 C) 98.1 F (36.7 C)  TempSrc: Oral Oral Oral Oral  Resp: 18 17 18 18   Height:      Weight:      SpO2: 100% 99% 98% 99%   CBG (last 3)   Recent Labs  08/28/13 1721  GLUCAP 89    IV Fluid Intake:      MEDICATIONS  . aspirin EC  325 mg Oral Daily  . heparin  5,000 Units Subcutaneous 3 times per day  . nicotine  21 mg Transdermal QHS  . pneumococcal 23 valent vaccine  0.5 mL Intramuscular Tomorrow-1000   PRN:  [email or web address]  Diet:  Cardiac thin liquids Activity:  OOB with assistance DVT Prophylaxis:  Heparin 5000 units sq tid   CLINICALLY SIGNIFICANT STUDIES Basic Metabolic Panel:   Recent Labs Lab 08/28/13 1400 08/29/13 0721  NA 141 141  K 4.9 5.0  CL 106 108  CO2 20 20  GLUCOSE 84 92  BUN 28* 33*  CREATININE 2.64* 2.60*  CALCIUM 8.9 8.6   Liver Function Tests:   Recent Labs Lab 08/28/13 1400   AST 11  ALT 9  ALKPHOS 78  BILITOT 0.3  PROT 7.0  ALBUMIN 3.9   CBC:   Recent Labs Lab 08/28/13 1400  WBC 10.4  NEUTROABS 7.3  HGB 12.2*  HCT 36.4*  MCV 103.1*  PLT 268   Coagulation:   Recent Labs Lab 08/28/13 1400  LABPROT 13.0  INR 0.98   Cardiac Enzymes: No results found for this basename: CKTOTAL, CKMB, CKMBINDEX, TROPONINI,  in the last 168 hours Urinalysis: No results found for this basename: COLORURINE, APPERANCEUR, LABSPEC, PHURINE, GLUCOSEU, HGBUR, BILIRUBINUR, KETONESUR, PROTEINUR, UROBILINOGEN, NITRITE, LEUKOCYTESUR,  in the last 168 hours Lipid Panel    Component Value Date/Time   CHOL 152 08/29/2013 0721   TRIG 125 08/29/2013 0721   HDL 34* 08/29/2013 0721   CHOLHDL 4.5 08/29/2013 0721   VLDL 25 08/29/2013 0721   LDLCALC 93 08/29/2013 0721   HgbA1C  Lab Results  Component Value Date   HGBA1C 5.7* 08/29/2013    Urine Drug Screen:   No results found for this basename: labopia,  cocainscrnur,  labbenz,  amphetmu,  thcu,  labbarb    Alcohol Level: No results found for this basename: ETH,  in the last 168 hours  CT of the brain  08/28/2013    No change since  the previous study. 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 No acute finding.     MRI / MRA of the brain  08/29/2013 Acute infarct posterior limb internal capsule on the right measuring 1 cm.  No other acute infarct identified at this time.  Mild chronic microvascular ischemic change in the white matter and pons.  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  Negative for hemorrhage or mass. No edema or shift of the midline structures.  Mild mucosal edema in the paranasal sinuses.   MRA of the brain  08/29/2013 Mild to moderate chronic microvascular ischemic changes  Moderate stenosis distal right vertebral artery.  No other significant intracranial stenosis.   Carotid Doppler  Findings suggest 1-39% internal carotid artery stenosis bilaterally. Vertebral arteries  are patent with antegrade flow.  2D Echocardiogram  Ejection fraction 65-70%. No cardiac source of emboli was identified.  CXR  08/29/2013    Low lung volumes without radiographic evidence of acute cardiopulmonary disease.   EKG  normal sinus rhythm. For complete results please see formal report.   Therapy Recommendations no PT or OT needs  Physical Exam   Pleasant middle aged male not in distress.Awake alert. Afebrile. Head is nontraumatic. Right eye dense cataract and blind.Neck is supple without bruit. Hearing is normal. Cardiac exam no murmur or gallop. Lungs are clear to auscultation. Distal pulses are well felt. Neurological Exam :  Awake  Alert oriented x 3. Normal speech and language.eye movements full without nystagmus.fundi were not visualized.Fundi were not visualized. 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 Hearing is normal. Palatal movements are normal. Face symmetric. Tongue midline. Normal strength, tone, reflexes and coordination. Normal sensation. Gait deferred.   ASSESSMENT Mr. Martin Schneider is a 58 y.o. male presenting with left sided numbness. MRI -  Acute infarct posterior limb internal capsule on the right measuring 1 cm. 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 On aspirin 81 mg orally every day prior to admission. Now on aspirin 325 mg orally every day for secondary stroke prevention. Patient with no resultant neuro deficits. Stroke work up underway.  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   Moderate stenosis distal right vertebral artery.   Tobacco use  Kidney disease  Hospital day # 2  TREATMENT/PLAN  Continue aspirin 325  mg orally every day for secondary stroke prevention.  PT and OT recommend no further therapy.  Counseled patient regarding smoking cessation  Probable discharge today.  Followup with Dr.Xu in one month.  The patient will need cataract surgery. Okay to proceed if surgery can be performed while on aspirin. If aspirin needs to be discontinued for surgery will need to wait at least 3 months.  Mikey Bussing PA-C [facility] Pager [phone] 08/30/2013, 7:57 AM   I have personally examined this patient, reviewed notes, independently viewed imaging studies, participated in medical decision making and plan of care.  s  . Agree with note above.  Antony Contras, MD      To contact Stroke Continuity provider, please refer to [email or web address] After hours, contact General Neurology

## 2013-09-10 ENCOUNTER — Ambulatory Visit (INDEPENDENT_AMBULATORY_CARE_PROVIDER_SITE_OTHER): Payer: BC Managed Care – PPO

## 2013-09-10 ENCOUNTER — Other Ambulatory Visit (HOSPITAL_BASED_OUTPATIENT_CLINIC_OR_DEPARTMENT_OTHER): Payer: Self-pay | Admitting: Nephrology

## 2013-09-10 DIAGNOSIS — R109 Unspecified abdominal pain: Secondary | ICD-10-CM

## 2013-09-10 DIAGNOSIS — Q619 Cystic kidney disease, unspecified: Secondary | ICD-10-CM

## 2013-10-23 ENCOUNTER — Ambulatory Visit: Payer: Self-pay | Admitting: Neurology

## 2016-01-21 ENCOUNTER — Other Ambulatory Visit: Payer: Self-pay | Admitting: Vascular Surgery

## 2016-01-21 DIAGNOSIS — Z01810 Encounter for preprocedural cardiovascular examination: Secondary | ICD-10-CM

## 2016-01-21 DIAGNOSIS — N184 Chronic kidney disease, stage 4 (severe): Secondary | ICD-10-CM

## 2016-02-29 ENCOUNTER — Encounter: Payer: Self-pay | Admitting: Vascular Surgery

## 2016-03-03 ENCOUNTER — Ambulatory Visit (INDEPENDENT_AMBULATORY_CARE_PROVIDER_SITE_OTHER): Payer: Medicare HMO | Admitting: Vascular Surgery

## 2016-03-03 ENCOUNTER — Encounter: Payer: Self-pay | Admitting: Vascular Surgery

## 2016-03-03 ENCOUNTER — Other Ambulatory Visit: Payer: Self-pay

## 2016-03-03 ENCOUNTER — Ambulatory Visit (INDEPENDENT_AMBULATORY_CARE_PROVIDER_SITE_OTHER)
Admission: RE | Admit: 2016-03-03 | Discharge: 2016-03-03 | Disposition: A | Discharge status: Home / Self Care | Payer: Medicare HMO | Source: Ambulatory Visit | Attending: Vascular Surgery | Admitting: Vascular Surgery

## 2016-03-03 ENCOUNTER — Ambulatory Visit (HOSPITAL_COMMUNITY)
Admission: RE | Admit: 2016-03-03 | Discharge: 2016-03-03 | Disposition: A | Discharge status: Home / Self Care | Payer: Medicare HMO | Source: Ambulatory Visit | Attending: Vascular Surgery | Admitting: Vascular Surgery

## 2016-03-03 VITALS — BP 126/79 | HR 59 | Temp 97.5°F | Resp 16 | Ht 66.0 in | Wt 210.0 lb

## 2016-03-03 DIAGNOSIS — N184 Chronic kidney disease, stage 4 (severe): Secondary | ICD-10-CM

## 2016-03-03 DIAGNOSIS — Z01810 Encounter for preprocedural cardiovascular examination: Secondary | ICD-10-CM

## 2016-03-03 NOTE — Progress Notes (Signed)
Referring Physician: Elmarie Shiley MD  Patient name: Martin Schneider MRN: [id number] DOB: 1955/07/24 Sex: male  REASON FOR CONSULT: Hemodialysis access  HPI: Martin Schneider is a 61 y.o. male referred by Dr. Posey Pronto for placement of a long-term hemodialysis access. The patient currently is not on dialysis. He is considered CK D3/4.  He is right-handed. He has had no prior access procedures. Other medical problems include hypertension which has had reasonable control.  Past Medical History:  Diagnosis Date  . Hypertension   . Kidney disease    Past Surgical History:  Procedure Laterality Date  . APPENDECTOMY    . CHOLECYSTECTOMY    . HERNIA REPAIR      Family History  Problem Relation Age of Onset  . Renal cancer Father   . Hypertension Father   . Alzheimer's disease Father   . Kidney failure Mother   . Diabetes Mother     SOCIAL HISTORY: Social History   Social History  . Marital status: Married    Spouse name: N/A  . Number of children: N/A  . Years of education: N/A   Occupational History  . Not on file.   Social History Main Topics  . Smoking status: Current Every Day Smoker    Packs/day: 1.00    Types: Cigarettes  . Smokeless tobacco: Former Systems developer    Types: Chew     Comment: 1 pk every 3 days.   . Alcohol use No  . Drug use: Unknown  . Sexual activity: Not on file   Other Topics Concern  . Not on file   Social History Narrative  . No narrative on file    Allergies  Allergen Reactions  . Codeine     REACTION: HA    Current Outpatient Prescriptions  Medication Sig Dispense Refill  . acetaminophen (TYLENOL) 325 MG tablet Take 650 mg by mouth every 6 (six) hours as needed.    Marland Kitchen amLODipine (NORVASC) 10 MG tablet Take 1 tablet (10 mg total) by mouth daily. 30 tablet 0  . aspirin EC 325 MG EC tablet Take 1 tablet (325 mg total) by mouth daily. 30 tablet 0  . atorvastatin (LIPITOR) 40 MG tablet Take 40 mg by mouth daily.    . Calcifediol ER 30 MCG CPCR Take  by mouth.    . carvedilol (COREG) 25 MG tablet Take 25 mg by mouth 2 (two) times daily with a meal.    . fexofenadine (ALLEGRA) 60 MG tablet Take 60 mg by mouth 2 (two) times daily.    . nicotine (NICODERM CQ - DOSED IN MG/24 HOURS) 21 mg/24hr patch Place 1 patch (21 mg total) onto the skin at bedtime. (Patient not taking: Reported on 03/03/2016) 28 patch 0   No current facility-administered medications for this visit.     ROS:   General:  No weight loss, Fever, chills  HEENT: No recent headaches, no nasal bleeding, no visual changes, no sore throat  Neurologic: No dizziness, blackouts, seizures. No recent symptoms of stroke or mini- stroke. No recent episodes of slurred speech, or temporary blindness.  Cardiac: No recent episodes of chest pain/pressure, no shortness of breath at rest.  No shortness of breath with exertion.  Denies history of atrial fibrillation or irregular heartbeat  Vascular: No history of rest pain in feet.  No history of claudication.  No history of non-healing ulcer, No history of DVT   Pulmonary: No home oxygen, no productive cough, no hemoptysis,  No asthma  or wheezing  Musculoskeletal:  [ ]  Arthritis, [ ]  Low back pain,  [ ]  Joint pain  Hematologic:No history of hypercoagulable state.  No history of easy bleeding.  No history of anemia  Gastrointestinal: No hematochezia or melena,  No gastroesophageal reflux, no trouble swallowing  Urinary: [X]  chronic Kidney disease, [ ]  on HD - [ ]  MWF or [ ]  TTHS, [ ]  Burning with urination, [ ]  Frequent urination, [ ]  Difficulty urinating;   Skin: No rashes  Psychological: No history of anxiety,  No history of depression   Physical Examination  Vitals:   03/03/16 0914  BP: 126/79  Pulse: (!) 59  Resp: 16  Temp: 97.5 F (36.4 C)  TempSrc: Oral  SpO2: 99%  Weight: 210 lb (95.3 kg)  Height: 5\' 6"  (1.676 m)    Body mass index is 33.89 kg/m.  General:  Alert and oriented, no acute distress HEENT:  Normal Neck: No bruit or JVD Pulmonary: Clear to auscultation bilaterally Cardiac: Regular Rate and Rhythm without murmur Abdomen: Soft, non-tender, non-distended, no mass, no scars Skin: No rash Extremity Pulses:  2+ radial, brachial, femoral, dorsalis pedis, posterior tibial pulses bilaterally Musculoskeletal: No deformity or edema  Neurologic: Upper and lower extremity motor 5/5 and symmetric  DATA:   arterial duplex of his upper extremities today which showed normal triphasic waveforms 3 mm radial arteries 5 mm brachial arteries. He also had vein mapping ultrasound today which showed the cephalic vein at the wrist with less than 2 mm. In the upper arm it is 3-4 mm. Basilic vein was 4-6 mm.  ASSESSMENT:  Patient needs long-term hemodialysis access. Veins in the left upper extremity seems suitable for creation of a left brachiocephalic AV fistula. This is scheduled for fluid were 20th 2018. Risks benefits possible complications procedure details including but not limited to bleeding infection non-maturation of the fistula ischemic steal were explained the patient today. He understands and agrees to proceed.   PLAN:  See above   Ruta Hinds, MD [facility] Office: [phone] Pager: [phone]

## 2016-03-17 ENCOUNTER — Telehealth: Payer: Self-pay

## 2016-03-17 NOTE — Telephone Encounter (Signed)
Spoke with Dr. Harlin Heys nurse, Bridgette.  Was advised that Dr. Harvel Quale is aware of plan for (L) arm AVF surgery 2/20, and approved proceeding with plan.  Notified pt. Of both Dr. Harvel Quale and Dr. Oneida Alar approving to proceed with AVF surgery.  Pt. Verb. Understanding.

## 2016-03-17 NOTE — Telephone Encounter (Signed)
-----   Message from Elam Dutch, MD sent at 03/15/2016 11:22 AM EST ----- Regarding: RE: recommendation He should be fine for fistula as far as I am concerned  Juanda Crumble ----- Message ----- From: Denman George, RN Sent: 03/15/2016  11:09 AM To: Elam Dutch, MD Subject: recommendation                                 This pt. Is scheduled for (L) arm AVF with you 2/20.  He called to report he had a skin cancer removed from his nose with a "flap" created on 03/10/16.  He was unsure if he should proceed with the AVF on 2/20.  I will call the surgeon that removed the skin cancer for clearance.  Any recommendations on your part?

## 2016-03-21 ENCOUNTER — Encounter (HOSPITAL_COMMUNITY): Payer: Self-pay | Admitting: *Deleted

## 2016-03-21 NOTE — Progress Notes (Signed)
Spoke with pt for pre-op call. Pt denies cardiac history, chest pain or sob. Instructed pt not to smoke as of now and until after surgery. Pt voiced understanding.

## 2016-03-22 ENCOUNTER — Telehealth: Payer: Self-pay | Admitting: Vascular Surgery

## 2016-03-22 ENCOUNTER — Encounter (HOSPITAL_COMMUNITY): Payer: Self-pay | Admitting: *Deleted

## 2016-03-22 ENCOUNTER — Ambulatory Visit (HOSPITAL_COMMUNITY): Payer: Medicare HMO | Admitting: Anesthesiology

## 2016-03-22 ENCOUNTER — Encounter (HOSPITAL_COMMUNITY)
Admission: RE | Disposition: A | Discharge status: Home / Self Care | Payer: Self-pay | Source: Ambulatory Visit | Attending: Vascular Surgery

## 2016-03-22 ENCOUNTER — Ambulatory Visit (HOSPITAL_COMMUNITY)
Admission: RE | Admit: 2016-03-22 | Discharge: 2016-03-22 | Disposition: A | Discharge status: Home / Self Care | Payer: Medicare HMO | Source: Ambulatory Visit | Attending: Vascular Surgery | Admitting: Vascular Surgery

## 2016-03-22 DIAGNOSIS — Z833 Family history of diabetes mellitus: Secondary | ICD-10-CM | POA: Diagnosis not present

## 2016-03-22 DIAGNOSIS — Z79899 Other long term (current) drug therapy: Secondary | ICD-10-CM | POA: Diagnosis not present

## 2016-03-22 DIAGNOSIS — N185 Chronic kidney disease, stage 5: Secondary | ICD-10-CM | POA: Diagnosis not present

## 2016-03-22 DIAGNOSIS — I129 Hypertensive chronic kidney disease with stage 1 through stage 4 chronic kidney disease, or unspecified chronic kidney disease: Secondary | ICD-10-CM | POA: Diagnosis not present

## 2016-03-22 DIAGNOSIS — Z7982 Long term (current) use of aspirin: Secondary | ICD-10-CM | POA: Diagnosis not present

## 2016-03-22 DIAGNOSIS — F17210 Nicotine dependence, cigarettes, uncomplicated: Secondary | ICD-10-CM | POA: Diagnosis not present

## 2016-03-22 DIAGNOSIS — N184 Chronic kidney disease, stage 4 (severe): Secondary | ICD-10-CM | POA: Insufficient documentation

## 2016-03-22 DIAGNOSIS — Z8051 Family history of malignant neoplasm of kidney: Secondary | ICD-10-CM | POA: Insufficient documentation

## 2016-03-22 HISTORY — DX: Personal history of urinary calculi: Z87.442

## 2016-03-22 HISTORY — DX: Malignant (primary) neoplasm, unspecified: C80.1

## 2016-03-22 HISTORY — PX: AV FISTULA PLACEMENT: SHX1204

## 2016-03-22 HISTORY — DX: Pneumonia, unspecified organism: J18.9

## 2016-03-22 HISTORY — DX: Personal history of other diseases of the digestive system: Z87.19

## 2016-03-22 HISTORY — DX: Cerebral infarction, unspecified: I63.9

## 2016-03-22 LAB — POCT I-STAT 4, (NA,K, GLUC, HGB,HCT)
Glucose, Bld: 98 mg/dL (ref 65–99)
HEMATOCRIT: 31 % — AB (ref 39.0–52.0)
HEMOGLOBIN: 10.5 g/dL — AB (ref 13.0–17.0)
POTASSIUM: 4.5 mmol/L (ref 3.5–5.1)
SODIUM: 142 mmol/L (ref 135–145)

## 2016-03-22 SURGERY — ARTERIOVENOUS (AV) FISTULA CREATION
Anesthesia: Monitor Anesthesia Care | Site: Arm Upper | Laterality: Left

## 2016-03-22 MED ORDER — FENTANYL CITRATE (PF) 100 MCG/2ML IJ SOLN
INTRAMUSCULAR | Status: DC | PRN
  Administered 2016-03-22: 50 ug via INTRAVENOUS
  Administered 2016-03-22 (×2): 25 ug via INTRAVENOUS

## 2016-03-22 MED ORDER — HEPARIN SODIUM (PORCINE) 1000 UNIT/ML IJ SOLN
INTRAMUSCULAR | Status: AC
  Filled 2016-03-22: qty 1

## 2016-03-22 MED ORDER — ONDANSETRON HCL 4 MG/2ML IJ SOLN
INTRAMUSCULAR | Status: AC
  Filled 2016-03-22: qty 2

## 2016-03-22 MED ORDER — FENTANYL CITRATE (PF) 100 MCG/2ML IJ SOLN
INTRAMUSCULAR | Status: AC
  Filled 2016-03-22: qty 2

## 2016-03-22 MED ORDER — SODIUM CHLORIDE 0.9 % IV SOLN
INTRAVENOUS | Status: DC | PRN
  Administered 2016-03-22: 500 mL

## 2016-03-22 MED ORDER — OXYCODONE-ACETAMINOPHEN 5-325 MG PO TABS: 1.0000 | ORAL_TABLET | Freq: Four times a day (QID) | ORAL | 0 refills | Status: DC | PRN

## 2016-03-22 MED ORDER — PROPOFOL 10 MG/ML IV BOLUS
INTRAVENOUS | Status: DC | PRN
  Administered 2016-03-22: 30 mg via INTRAVENOUS

## 2016-03-22 MED ORDER — HEPARIN SODIUM (PORCINE) 1000 UNIT/ML IJ SOLN
INTRAMUSCULAR | Status: DC | PRN
  Administered 2016-03-22: 5000 [IU] via INTRAVENOUS

## 2016-03-22 MED ORDER — CHLORHEXIDINE GLUCONATE CLOTH 2 % EX PADS: 6.0000 | MEDICATED_PAD | Freq: Once | CUTANEOUS | Status: DC

## 2016-03-22 MED ORDER — DEXTROSE 5 % IV SOLN
1.5000 g | INTRAVENOUS | Status: AC
  Administered 2016-03-22: 1.5 g via INTRAVENOUS

## 2016-03-22 MED ORDER — LIDOCAINE HCL (PF) 1 % IJ SOLN
INTRAMUSCULAR | Status: AC
  Filled 2016-03-22: qty 30

## 2016-03-22 MED ORDER — MIDAZOLAM HCL 2 MG/2ML IJ SOLN
INTRAMUSCULAR | Status: AC
  Filled 2016-03-22: qty 2

## 2016-03-22 MED ORDER — PROPOFOL 500 MG/50ML IV EMUL
INTRAVENOUS | Status: DC | PRN
  Administered 2016-03-22: 75 ug/kg/min via INTRAVENOUS

## 2016-03-22 MED ORDER — PROPOFOL 10 MG/ML IV BOLUS
INTRAVENOUS | Status: AC
  Filled 2016-03-22: qty 20

## 2016-03-22 MED ORDER — SODIUM CHLORIDE 0.9 % IV SOLN
INTRAVENOUS | Status: DC
  Administered 2016-03-22 (×2): via INTRAVENOUS

## 2016-03-22 MED ORDER — MIDAZOLAM HCL 5 MG/5ML IJ SOLN
INTRAMUSCULAR | Status: DC | PRN
  Administered 2016-03-22: 2 mg via INTRAVENOUS

## 2016-03-22 MED ORDER — CEFUROXIME SODIUM 1.5 G IJ SOLR
INTRAMUSCULAR | Status: AC
  Filled 2016-03-22: qty 1.5

## 2016-03-22 MED ORDER — THROMBIN 20000 UNITS EX SOLR
CUTANEOUS | Status: AC
  Filled 2016-03-22: qty 20000

## 2016-03-22 MED ORDER — LIDOCAINE HCL (PF) 1 % IJ SOLN
INTRAMUSCULAR | Status: DC | PRN
  Administered 2016-03-22: 30 mL via SUBCUTANEOUS

## 2016-03-22 MED ORDER — 0.9 % SODIUM CHLORIDE (POUR BTL) OPTIME
TOPICAL | Status: DC | PRN
  Administered 2016-03-22: 1000 mL

## 2016-03-22 SURGICAL SUPPLY — 28 items
ARMBAND PINK RESTRICT EXTREMIT (MISCELLANEOUS) ×4 IMPLANT
CANISTER SUCTION 2500CC (MISCELLANEOUS) ×2 IMPLANT
CANNULA VESSEL 3MM 2 BLNT TIP (CANNULA) ×2 IMPLANT
CLIP TI MEDIUM 6 (CLIP) ×2 IMPLANT
CLIP TI WIDE RED SMALL 6 (CLIP) ×2 IMPLANT
COVER PROBE W GEL 5X96 (DRAPES) IMPLANT
DECANTER SPIKE VIAL GLASS SM (MISCELLANEOUS) ×2 IMPLANT
DERMABOND ADVANCED (GAUZE/BANDAGES/DRESSINGS) ×1
DERMABOND ADVANCED .7 DNX12 (GAUZE/BANDAGES/DRESSINGS) ×1 IMPLANT
DRAIN PENROSE 1/4X12 LTX STRL (WOUND CARE) ×2 IMPLANT
ELECT REM PT RETURN 9FT ADLT (ELECTROSURGICAL) ×2
ELECTRODE REM PT RTRN 9FT ADLT (ELECTROSURGICAL) ×1 IMPLANT
GLOVE BIO SURGEON STRL SZ7.5 (GLOVE) ×2 IMPLANT
GOWN STRL REUS W/ TWL LRG LVL3 (GOWN DISPOSABLE) ×4 IMPLANT
GOWN STRL REUS W/TWL LRG LVL3 (GOWN DISPOSABLE) ×4
KIT BASIN OR (CUSTOM PROCEDURE TRAY) ×2 IMPLANT
KIT ROOM TURNOVER OR (KITS) ×2 IMPLANT
LOOP VESSEL MINI RED (MISCELLANEOUS) IMPLANT
NS IRRIG 1000ML POUR BTL (IV SOLUTION) ×2 IMPLANT
PACK CV ACCESS (CUSTOM PROCEDURE TRAY) ×2 IMPLANT
PAD ARMBOARD 7.5X6 YLW CONV (MISCELLANEOUS) ×4 IMPLANT
SPONGE SURGIFOAM ABS GEL 100 (HEMOSTASIS) IMPLANT
SUT PROLENE 7 0 BV 1 (SUTURE) ×2 IMPLANT
SUT VIC AB 3-0 SH 27 (SUTURE) ×1
SUT VIC AB 3-0 SH 27X BRD (SUTURE) ×1 IMPLANT
SUT VICRYL 4-0 PS2 18IN ABS (SUTURE) ×2 IMPLANT
UNDERPAD 30X30 (UNDERPADS AND DIAPERS) ×2 IMPLANT
WATER STERILE IRR 1000ML POUR (IV SOLUTION) ×2 IMPLANT

## 2016-03-22 NOTE — Telephone Encounter (Signed)
Spoke to spouse for appts on 4/5 [place] and OV mailed letter

## 2016-03-22 NOTE — Anesthesia Procedure Notes (Signed)
Procedure Name: MAC Date/Time: 03/22/2016 10:23 AM Performed by: Izora Gala Patient Re-evaluated:Patient Re-evaluated prior to inductionOxygen Delivery Method: Nasal cannula Intubation Type: IV induction Placement Confirmation: positive ETCO2

## 2016-03-22 NOTE — H&P (View-Only) (Signed)
Referring Physician: Elmarie Shiley MD  Patient name: Martin Schneider MRN: [id number] DOB: 1955/04/15 Sex: male  REASON FOR CONSULT: Hemodialysis access  HPI: Martin Schneider is a 61 y.o. male referred by Dr. Posey Pronto for placement of a long-term hemodialysis access. The patient currently is not on dialysis. He is considered CK D3/4.  He is right-handed. He has had no prior access procedures. Other medical problems include hypertension which has had reasonable control.  Past Medical History:  Diagnosis Date  . Hypertension   . Kidney disease    Past Surgical History:  Procedure Laterality Date  . APPENDECTOMY    . CHOLECYSTECTOMY    . HERNIA REPAIR      Family History  Problem Relation Age of Onset  . Renal cancer Father   . Hypertension Father   . Alzheimer's disease Father   . Kidney failure Mother   . Diabetes Mother     SOCIAL HISTORY: Social History   Social History  . Marital status: Married    Spouse name: N/A  . Number of children: N/A  . Years of education: N/A   Occupational History  . Not on file.   Social History Main Topics  . Smoking status: Current Every Day Smoker    Packs/day: 1.00    Types: Cigarettes  . Smokeless tobacco: Former Systems developer    Types: Chew     Comment: 1 pk every 3 days.   . Alcohol use No  . Drug use: Unknown  . Sexual activity: Not on file   Other Topics Concern  . Not on file   Social History Narrative  . No narrative on file    Allergies  Allergen Reactions  . Codeine     REACTION: HA    Current Outpatient Prescriptions  Medication Sig Dispense Refill  . acetaminophen (TYLENOL) 325 MG tablet Take 650 mg by mouth every 6 (six) hours as needed.    Marland Kitchen amLODipine (NORVASC) 10 MG tablet Take 1 tablet (10 mg total) by mouth daily. 30 tablet 0  . aspirin EC 325 MG EC tablet Take 1 tablet (325 mg total) by mouth daily. 30 tablet 0  . atorvastatin (LIPITOR) 40 MG tablet Take 40 mg by mouth daily.    . Calcifediol ER 30 MCG CPCR Take  by mouth.    . carvedilol (COREG) 25 MG tablet Take 25 mg by mouth 2 (two) times daily with a meal.    . fexofenadine (ALLEGRA) 60 MG tablet Take 60 mg by mouth 2 (two) times daily.    . nicotine (NICODERM CQ - DOSED IN MG/24 HOURS) 21 mg/24hr patch Place 1 patch (21 mg total) onto the skin at bedtime. (Patient not taking: Reported on 03/03/2016) 28 patch 0   No current facility-administered medications for this visit.     ROS:   General:  No weight loss, Fever, chills  HEENT: No recent headaches, no nasal bleeding, no visual changes, no sore throat  Neurologic: No dizziness, blackouts, seizures. No recent symptoms of stroke or mini- stroke. No recent episodes of slurred speech, or temporary blindness.  Cardiac: No recent episodes of chest pain/pressure, no shortness of breath at rest.  No shortness of breath with exertion.  Denies history of atrial fibrillation or irregular heartbeat  Vascular: No history of rest pain in feet.  No history of claudication.  No history of non-healing ulcer, No history of DVT   Pulmonary: No home oxygen, no productive cough, no hemoptysis,  No asthma  or wheezing  Musculoskeletal:  [ ]  Arthritis, [ ]  Low back pain,  [ ]  Joint pain  Hematologic:No history of hypercoagulable state.  No history of easy bleeding.  No history of anemia  Gastrointestinal: No hematochezia or melena,  No gastroesophageal reflux, no trouble swallowing  Urinary: [X]  chronic Kidney disease, [ ]  on HD - [ ]  MWF or [ ]  TTHS, [ ]  Burning with urination, [ ]  Frequent urination, [ ]  Difficulty urinating;   Skin: No rashes  Psychological: No history of anxiety,  No history of depression   Physical Examination  Vitals:   03/03/16 0914  BP: 126/79  Pulse: (!) 59  Resp: 16  Temp: 97.5 F (36.4 C)  TempSrc: Oral  SpO2: 99%  Weight: 210 lb (95.3 kg)  Height: 5\' 6"  (1.676 m)    Body mass index is 33.89 kg/m.  General:  Alert and oriented, no acute distress HEENT:  Normal Neck: No bruit or JVD Pulmonary: Clear to auscultation bilaterally Cardiac: Regular Rate and Rhythm without murmur Abdomen: Soft, non-tender, non-distended, no mass, no scars Skin: No rash Extremity Pulses:  2+ radial, brachial, femoral, dorsalis pedis, posterior tibial pulses bilaterally Musculoskeletal: No deformity or edema  Neurologic: Upper and lower extremity motor 5/5 and symmetric  DATA:   arterial duplex of his upper extremities today which showed normal triphasic waveforms 3 mm radial arteries 5 mm brachial arteries. He also had vein mapping ultrasound today which showed the cephalic vein at the wrist with less than 2 mm. In the upper arm it is 3-4 mm. Basilic vein was 4-6 mm.  ASSESSMENT:  Patient needs long-term hemodialysis access. Veins in the left upper extremity seems suitable for creation of a left brachiocephalic AV fistula. This is scheduled for fluid were 20th 2018. Risks benefits possible complications procedure details including but not limited to bleeding infection non-maturation of the fistula ischemic steal were explained the patient today. He understands and agrees to proceed.   PLAN:  See above   Ruta Hinds, MD [facility] Office: [phone] Pager: [phone]

## 2016-03-22 NOTE — Anesthesia Postprocedure Evaluation (Addendum)
Anesthesia Post Note  Patient: Martin Schneider  Procedure(s) Performed: Procedure(s) (LRB): ARTERIOVENOUS (AV) FISTULA CREATION LEFT ARM (Left)  Patient location during evaluation: PACU Anesthesia Type: MAC Level of consciousness: awake Pain management: pain level controlled Vital Signs Assessment: post-procedure vital signs reviewed and stable Respiratory status: spontaneous breathing Cardiovascular status: stable Postop Assessment: no signs of nausea or vomiting Anesthetic complications: no        Last Vitals:  Vitals:   03/22/16 1145 03/22/16 1200  BP: 122/75 137/76  Pulse: 63 63  Resp: 19 18  Temp: 36.7 C     Last Pain:  Vitals:   03/22/16 1200  TempSrc:   PainSc: 0-No pain   Pain Goal: Patients Stated Pain Goal: 1 (03/22/16 0836)               Alek Poncedeleon JR,JOHN Mateo Flow

## 2016-03-22 NOTE — Op Note (Signed)
Procedure: Left Brachial Cephalic AV fistula  Preop: ESRD  Postop: ESRD  Anesthesia: Local with sedation  Assistant: Silva Bandy PA-C  Findings: 3.5 mm cephalic vein  Procedure: After obtaining informed consent, the patient was taken to the operating room.  After adequate sedation, the left upper extremity was prepped and draped in usual sterile fashion.  A transverse incision was then made near the antecubital crease the left arm. The incision was carried into the subcutaneous tissues down to level of the cephalic vein. The cephalic vein was approximately 3.5 mm in diameter. It was of good quality. This was dissected free circumferentially and small side branches ligated and divided between silk ties or clips. Next the brachial artery was dissected free in the medial portion of the incision. The artery was  3-4 mm in diameter. The vessel loops were placed proximal and distal to the planned site of arteriotomy. The patient was given 5000 units of intravenous heparin. After appropriate circulation time, the vessel loops were used to control the artery. A longitudinal opening was made in the brachial artery.  The vein was ligated distally with a 2-0 silk tie. The vein was controlled proximally with a fine bulldog clamp. The vein was then swung over to the artery and sewn end of vein to side of artery using a running 7-0 Prolene suture. Just prior to completion of the anastomosis, everything was fore bled back bled and thoroughly flushed. The anastomosis was secured, vessel loops released, and there was a palpable thrill in the fistula immediately. After hemostasis was obtained, the subcutaneous tissues were reapproximated using a running 3-0 Vicryl suture. The skin was then closed with a 4 Vicryl subcuticular stitch. Dermabond was applied to the skin incision.  The patient had a palpable radial pulse at the end of the case.  Ruta Hinds, MD [facility] [place] Office:  [phone] Pager: [phone]

## 2016-03-22 NOTE — Telephone Encounter (Signed)
-----   Message from Mena Goes, RN sent at 03/22/2016 11:44 AM EST ----- Regarding: schedule   ----- Message ----- From: Alvia Grove, PA-C Sent: 03/22/2016  11:27 AM To: Vvs Charge Pool  S/p left brachial- cephalic AVF [id number]  F/u with Dr. Oneida Alar in 4-6 weeks with duplex  Thanks Maudie Mercury

## 2016-03-22 NOTE — Interval H&P Note (Signed)
History and Physical Interval Note:  03/22/2016 9:56 AM  Martin Schneider  has presented today for surgery, with the diagnosis of Stage IV chronic kidney disease N18.4  The various methods of treatment have been discussed with the patient and family. After consideration of risks, benefits and other options for treatment, the patient has consented to  Procedure(s): ARTERIOVENOUS (AV) FISTULA CREATION (Left) as a surgical intervention .  The patient's history has been reviewed, patient examined, no change in status, stable for surgery.  I have reviewed the patient's chart and labs.  Questions were answered to the patient's satisfaction.     Ruta Hinds

## 2016-03-22 NOTE — Anesthesia Preprocedure Evaluation (Signed)
Anesthesia Evaluation  Patient identified by MRN, date of birth, ID band Patient awake    Reviewed: Allergy & Precautions, NPO status , Patient's Chart, lab work & pertinent test results  Airway Mallampati: I       Dental  (+) Poor Dentition, Missing   Pulmonary Current Smoker,    Pulmonary exam normal        Cardiovascular hypertension, Pt. on medications Normal cardiovascular exam     Neuro/Psych negative psych ROS   GI/Hepatic   Endo/Other  negative endocrine ROS  Renal/GU CRFRenal disease     Musculoskeletal negative musculoskeletal ROS (+)   Abdominal (+) + obese,   Peds  Hematology   Anesthesia Other Findings   Reproductive/Obstetrics                             Anesthesia Physical Anesthesia Plan  ASA: III  Anesthesia Plan: MAC   Post-op Pain Management:    Induction: Intravenous  Airway Management Planned: Natural Airway and Simple Face Mask  Additional Equipment:   Intra-op Plan:   Post-operative Plan:   Informed Consent: I have reviewed the patients History and Physical, chart, labs and discussed the procedure including the risks, benefits and alternatives for the proposed anesthesia with the patient or authorized representative who has indicated his/her understanding and acceptance.     Plan Discussed with: CRNA and Surgeon  Anesthesia Plan Comments:         Anesthesia Quick Evaluation

## 2016-03-22 NOTE — Transfer of Care (Signed)
Immediate Anesthesia Transfer of Care Note  Patient: Martin Schneider  Procedure(s) Performed: Procedure(s): ARTERIOVENOUS (AV) FISTULA CREATION LEFT ARM (Left)  Patient Location: PACU  Anesthesia Type:MAC  Level of Consciousness: awake, alert , oriented and patient cooperative  Airway & Oxygen Therapy: Patient Spontanous Breathing and Patient connected to nasal cannula oxygen  Post-op Assessment: Report given to RN, Post -op Vital signs reviewed and stable, Patient moving all extremities and Patient moving all extremities X 4  Post vital signs: Reviewed and stable  Last Vitals:  Vitals:   03/22/16 0807 03/22/16 1135  BP: 129/77 (P) 113/62  Pulse: 60 (P) 63  Resp: 20 (P) 18  Temp: 36.8 C (P) 36.6 C    Last Pain:  Vitals:   03/22/16 0807  TempSrc: Oral      Patients Stated Pain Goal: 1 (52/84/13 2440)  Complications: No apparent anesthesia complications

## 2016-03-23 ENCOUNTER — Encounter (HOSPITAL_COMMUNITY): Payer: Self-pay | Admitting: Vascular Surgery

## 2016-04-26 ENCOUNTER — Encounter: Payer: Self-pay | Admitting: Vascular Surgery

## 2016-04-26 NOTE — Addendum Note (Signed)
Addended by: Lianne Cure A on: 04/26/2016 09:36 AM   Modules accepted: Orders

## 2016-05-05 ENCOUNTER — Ambulatory Visit (INDEPENDENT_AMBULATORY_CARE_PROVIDER_SITE_OTHER): Payer: Self-pay | Admitting: Vascular Surgery

## 2016-05-05 ENCOUNTER — Ambulatory Visit (HOSPITAL_COMMUNITY)
Admission: RE | Admit: 2016-05-05 | Discharge: 2016-05-05 | Disposition: A | Discharge status: Home / Self Care | Payer: Medicare HMO | Source: Ambulatory Visit | Attending: Vascular Surgery | Admitting: Vascular Surgery

## 2016-05-05 ENCOUNTER — Encounter: Payer: Self-pay | Admitting: Vascular Surgery

## 2016-05-05 VITALS — BP 105/67 | HR 57 | Temp 97.0°F | Resp 20 | Ht 66.0 in | Wt 215.0 lb

## 2016-05-05 DIAGNOSIS — N184 Chronic kidney disease, stage 4 (severe): Secondary | ICD-10-CM

## 2016-05-05 DIAGNOSIS — Z4931 Encounter for adequacy testing for hemodialysis: Secondary | ICD-10-CM | POA: Diagnosis present

## 2016-05-05 NOTE — Progress Notes (Signed)
Patient is a 61 year old male who returns for follow-up today. He had a left brachiocephalic AV fistula placed figure 20th 2018. He is currently not on hemodialysis. His primary nephrologist is Dr. Posey Pronto. He denies any numbness or tingling in his left hand. He does have slight decreased temperature in the left hand compared to the right.  Physical exam:  Vitals:   05/05/16 0902  BP: 105/67  Pulse: (!) 57  Resp: 20  Temp: 97 F (36.1 C)  TempSrc: Oral  SpO2: 96%  Weight: 215 lb (97.5 kg)  Height: 5\' 6"  (1.676 m)    Extremity: Well-healed left antecubital incision palpable thrill in fistula fistulas palpable throughout the mid upper arm. Left hand is pink warm well perfused.  Data: Patient had a duplex ultrasound of his AV fistula today. This showed it was 8 mm in diameter 5 mm from the skin surface throughout most of its course.  Assessment: Maturing AV fistula left arm. Patient is currently not on hemodialysis. He will continue to exercise the fistula. 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 Signs and symptoms of worsening steal over time or explained the patient today and he will call me if he has navies. Otherwise he will follow-up on as-needed basis.  Plan: See above  Ruta Hinds, MD [facility] Office: [phone] Pager: [phone]

## 2016-06-07 IMAGING — MR MR MRA HEAD W/O CM
9 of 12 series · 30 of 48 positions shown · non-contrast
Comparison: CT 08/28/2013

CLINICAL DATA: Stroke. Left arm and leg numbness. Expressive
aphasia.

EXAM:
MRI HEAD WITHOUT CONTRAST
MRA HEAD WITHOUT CONTRAST
TECHNIQUE: Multiplanar, multiecho pulse sequences of the brain and surrounding
structures were obtained without intravenous contrast. Angiographic
images of the head were obtained using MRA technique without
contrast.

[Series 3: DWI · axial · 5.0mm · 1.09mm/px · z∈[-71,+72]mm · 4 of 60 slices shown (1 of 4)]
[im 1/60]
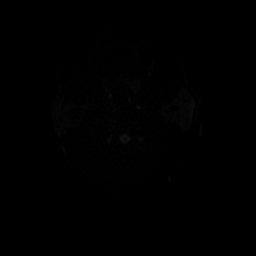
[im 20/60]
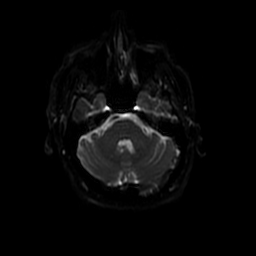
[im 40/60]
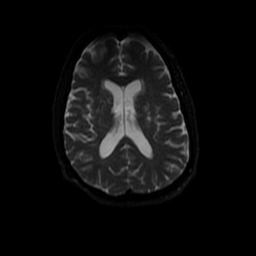
[im 60/60]
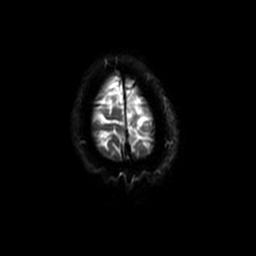

[Series 4: (id) mt fs · axial · 1.4mm · 0.43mm/px · z∈[-68,+2]mm · 6 of 152 slices shown]
[im 1/152]
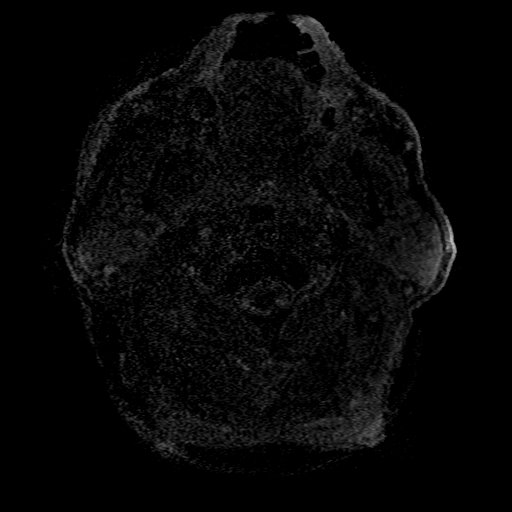
[im 26/152]
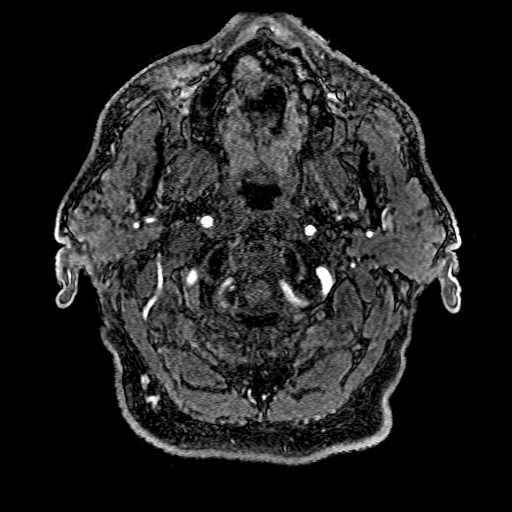
[im 51/152]
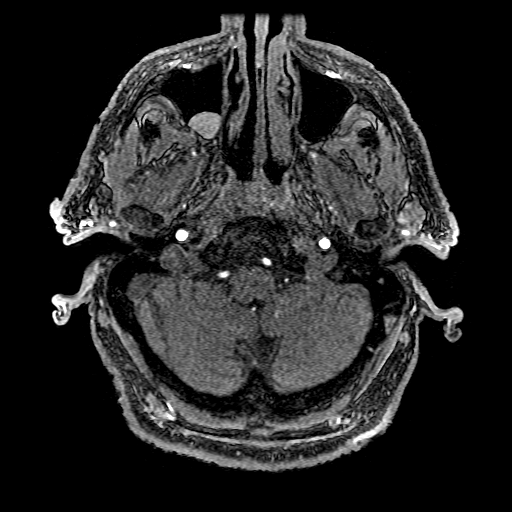
[im 63/152]
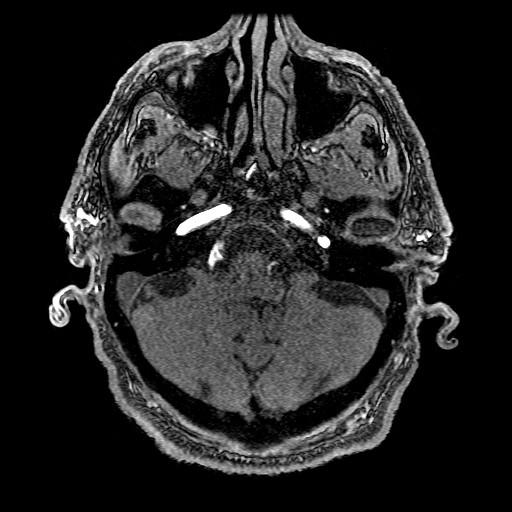
[im 89/152]
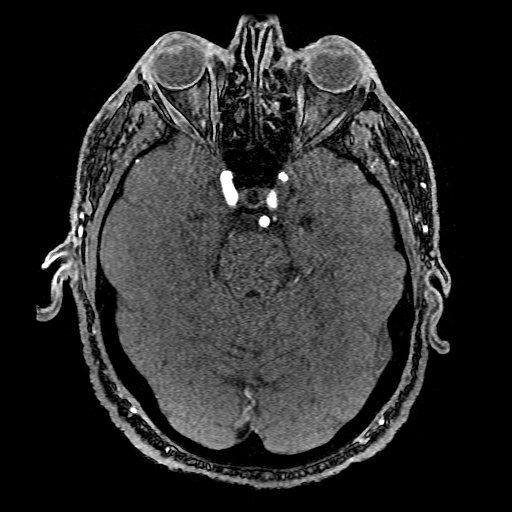
[im 101/152]
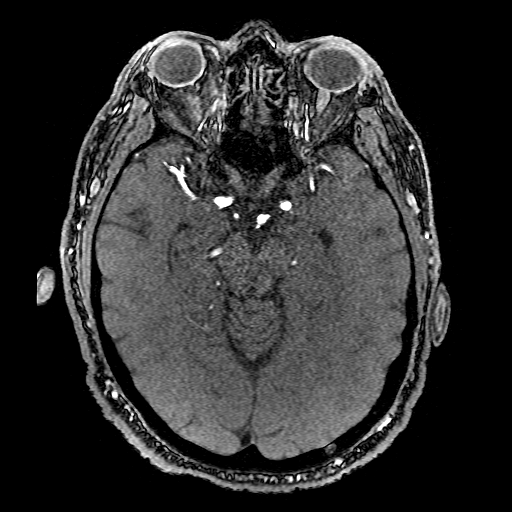

[Series 5: T2 · axial · 5.0mm · 0.43mm/px · z∈[-64,+78]mm · 2 of 25 slices shown (1 of 2)]
[im 1/25]
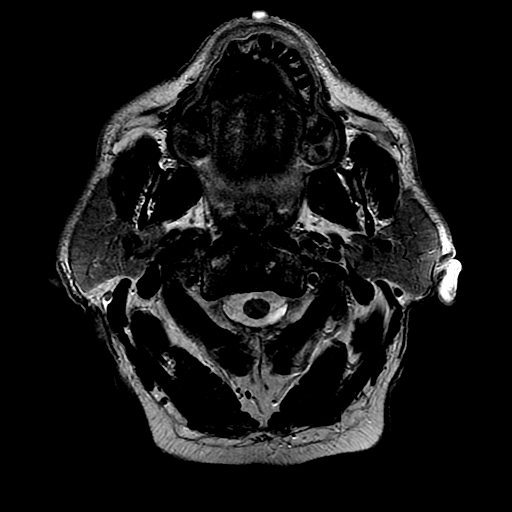
[im 25/25]
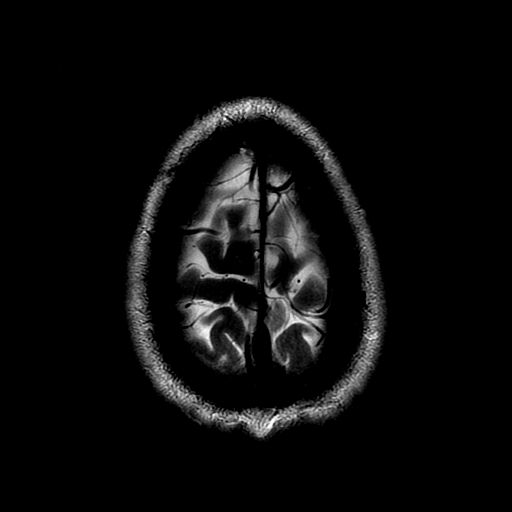

[Series 6: FLAIR · axial · 5.0mm · 0.43mm/px · z∈[-64,+78]mm · 2 of 25 slices shown]
[im 1/25]
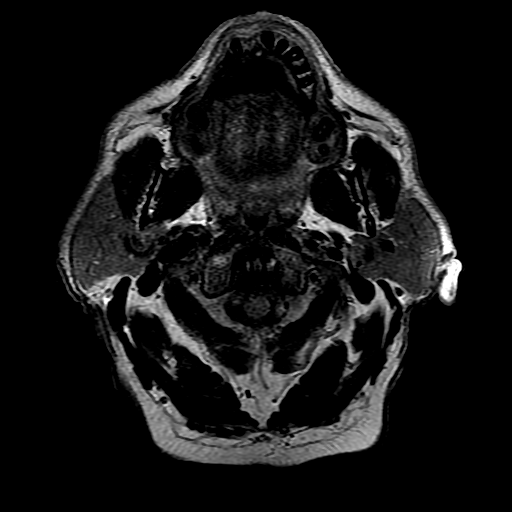
[im 25/25]
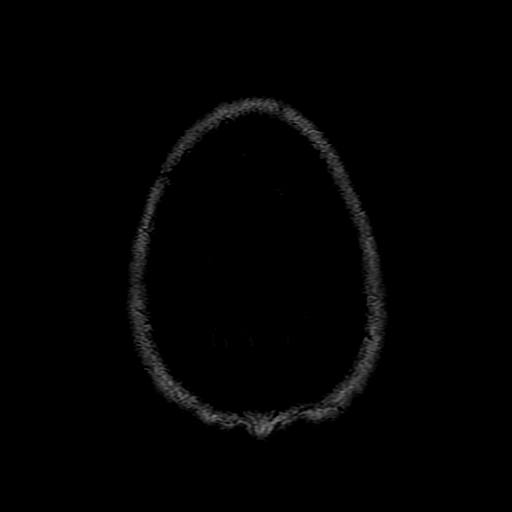

[Series 7: DWI · coronal · 5.0mm · 1.09mm/px · 6 of 66 slices shown (2 of 4)]
[im 1/66]
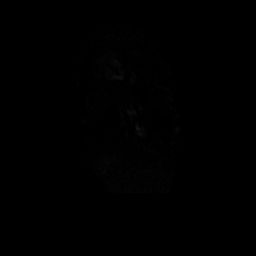
[im 14/66]
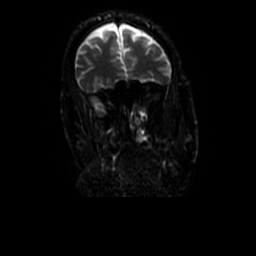
[im 27/66]
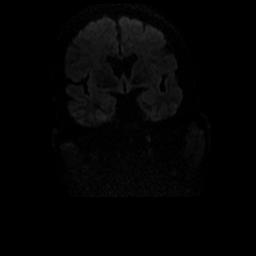
[im 40/66]
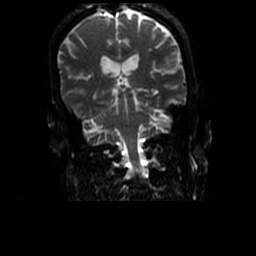
[im 53/66]
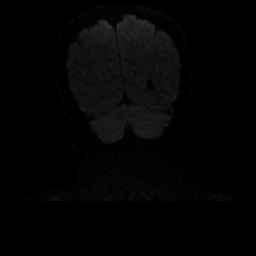
[im 66/66]
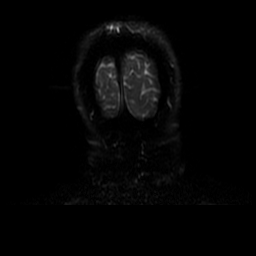

[Series 8: T1 · sagittal · 5.0mm · 0.47mm/px · 2 of 23 slices shown]
[im 1/23]
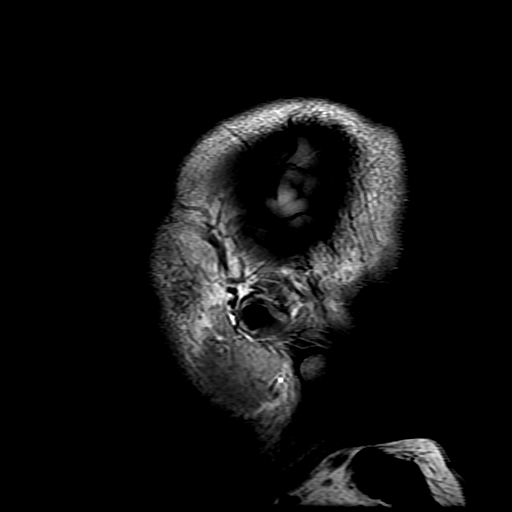
[im 23/23]
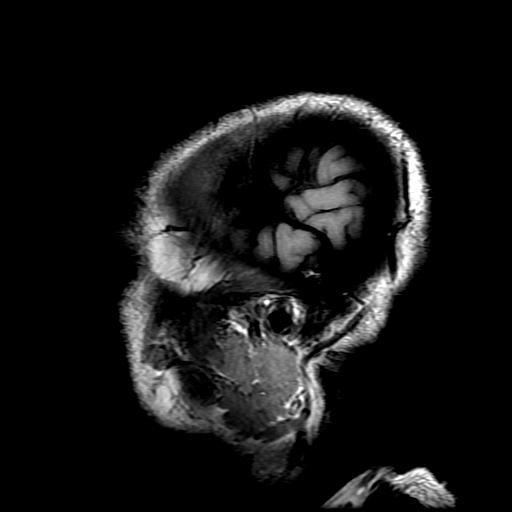

[Series 11: T2 · coronal · 5.0mm · 0.43mm/px · 2 of 28 slices shown (2 of 2)]
[im 1/28]
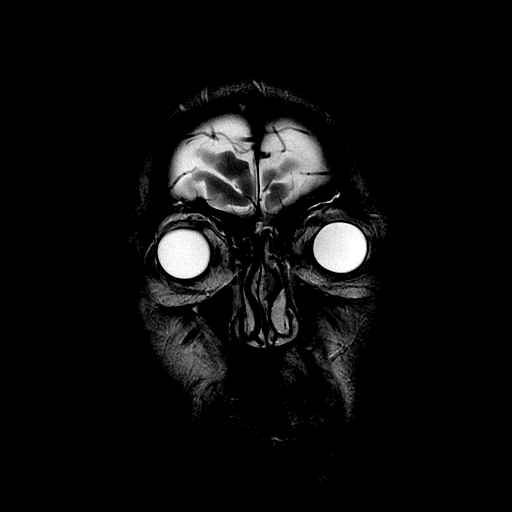
[im 28/28]
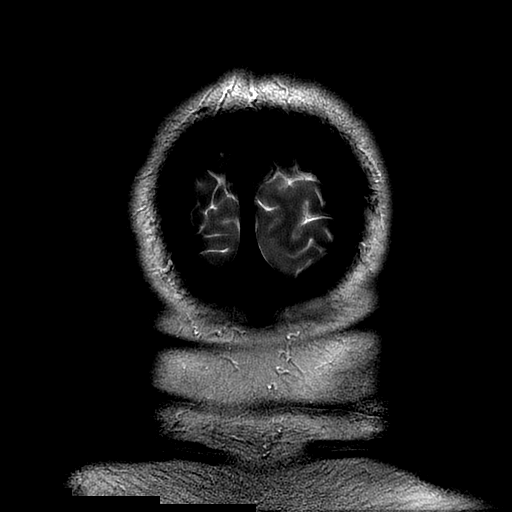

[Series 300: DWI · axial · 5.0mm · 1.09mm/px · z∈[-71,+72]mm · 3 of 30 slices shown (3 of 4)]
[im 1/30]
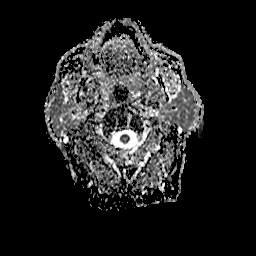
[im 15/30]
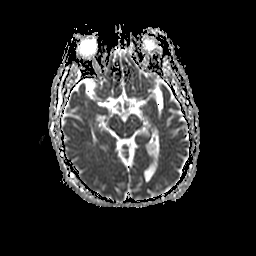
[im 30/30]
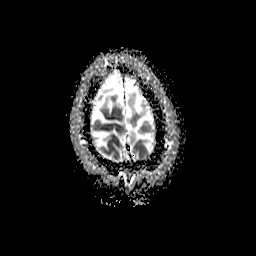

[Series 700: DWI · coronal · 5.0mm · 1.09mm/px · 3 of 33 slices shown (4 of 4)]
[im 1/33]
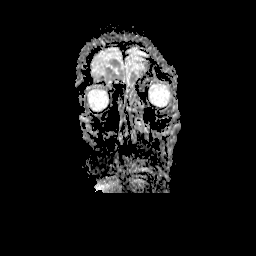
[im 17/33]
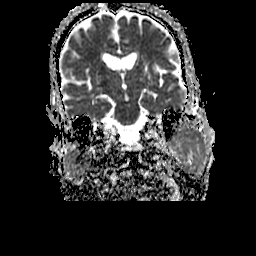
[im 33/33]
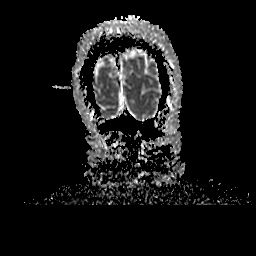

[30 of 48 positions shown; findings below may reference images not displayed]

FINDINGS: MRI HEAD FINDINGS

Acute infarct posterior limb internal capsule on the right measuring
1 cm. No other acute infarct identified at this time.

Mild chronic microvascular ischemic change in the white matter and
pons. Small chronic infarcts in the basal ganglia.

Negative for hemorrhage or mass. No edema or shift of the midline
structures.

Mild mucosal edema in the paranasal sinuses.

MRA HEAD FINDINGS

Both vertebral arteries are patent to the basilar. Moderate stenosis
distal right vertebral artery. PICA patent bilaterally. Basilar
artery is tortuous and patent. Superior cerebellar arteries are
patent bilaterally.

Internal carotid artery is widely patent bilaterally. Anterior and
middle cerebral arteries are normal bilaterally

Negative for cerebral aneurysm.
IMPRESSION: Acute infarct right posterior limb internal capsule.

Mild to moderate chronic microvascular ischemic changes

Moderate stenosis distal right vertebral artery. No other
significant intracranial stenosis.

## 2016-07-08 NOTE — Addendum Note (Signed)
Addendum  created 07/08/16 1028 by Lyn Hollingshead, MD   Sign clinical note

## 2020-06-09 ENCOUNTER — Ambulatory Visit: Payer: Self-pay | Admitting: Allergy & Immunology

## 2023-10-02 DEATH — deceased
